# Patient Record
Sex: Female | Born: 1975
Health system: Southern US, Community
[De-identification: ages and names within clinical notes are randomized; demographics above are authoritative.]

## PROBLEM LIST (undated history)

## (undated) DIAGNOSIS — F329 Major depressive disorder, single episode, unspecified: Secondary | ICD-10-CM

## (undated) DIAGNOSIS — J45909 Unspecified asthma, uncomplicated: Secondary | ICD-10-CM

## (undated) DIAGNOSIS — T4145XA Adverse effect of unspecified anesthetic, initial encounter: Secondary | ICD-10-CM

## (undated) DIAGNOSIS — D219 Benign neoplasm of connective and other soft tissue, unspecified: Secondary | ICD-10-CM

## (undated) DIAGNOSIS — G43909 Migraine, unspecified, not intractable, without status migrainosus: Secondary | ICD-10-CM

## (undated) DIAGNOSIS — F32A Depression, unspecified: Secondary | ICD-10-CM

## (undated) DIAGNOSIS — M797 Fibromyalgia: Secondary | ICD-10-CM

## (undated) DIAGNOSIS — T8859XA Other complications of anesthesia, initial encounter: Secondary | ICD-10-CM

## (undated) DIAGNOSIS — K219 Gastro-esophageal reflux disease without esophagitis: Secondary | ICD-10-CM

## (undated) DIAGNOSIS — Z8781 Personal history of (healed) traumatic fracture: Secondary | ICD-10-CM

## (undated) DIAGNOSIS — Z803 Family history of malignant neoplasm of breast: Secondary | ICD-10-CM

## (undated) DIAGNOSIS — Z9109 Other allergy status, other than to drugs and biological substances: Secondary | ICD-10-CM

## (undated) DIAGNOSIS — Z8489 Family history of other specified conditions: Secondary | ICD-10-CM

## (undated) DIAGNOSIS — K819 Cholecystitis, unspecified: Secondary | ICD-10-CM

## (undated) DIAGNOSIS — S42009A Fracture of unspecified part of unspecified clavicle, initial encounter for closed fracture: Secondary | ICD-10-CM

## (undated) DIAGNOSIS — Z800 Family history of malignant neoplasm of digestive organs: Secondary | ICD-10-CM

## (undated) HISTORY — DX: Benign neoplasm of connective and other soft tissue, unspecified: D21.9

## (undated) HISTORY — DX: Family history of malignant neoplasm of digestive organs: Z80.0

## (undated) HISTORY — DX: Depression, unspecified: F32.A

## (undated) HISTORY — DX: Family history of malignant neoplasm of breast: Z80.3

## (undated) HISTORY — DX: Gastro-esophageal reflux disease without esophagitis: K21.9

## (undated) HISTORY — DX: Major depressive disorder, single episode, unspecified: F32.9

---
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
## 1998-09-06 HISTORY — PX: ORIF CLAVICLE FRACTURE: SUR924

## 2000-09-15 ENCOUNTER — Emergency Department (HOSPITAL_COMMUNITY): Admission: EM | Admit: 2000-09-15 | Discharge: 2000-09-15 | Payer: Self-pay | Admitting: Emergency Medicine

## 2000-09-15 ENCOUNTER — Encounter: Payer: Self-pay | Admitting: Emergency Medicine

## 2000-09-16 ENCOUNTER — Emergency Department (HOSPITAL_COMMUNITY): Admission: EM | Admit: 2000-09-16 | Discharge: 2000-09-16 | Payer: Self-pay

## 2000-09-17 ENCOUNTER — Emergency Department (HOSPITAL_COMMUNITY): Admission: EM | Admit: 2000-09-17 | Discharge: 2000-09-17 | Payer: Self-pay | Admitting: Emergency Medicine

## 2001-01-23 ENCOUNTER — Other Ambulatory Visit: Admission: RE | Admit: 2001-01-23 | Discharge: 2001-01-23 | Payer: Self-pay | Admitting: Obstetrics and Gynecology

## 2002-02-09 ENCOUNTER — Other Ambulatory Visit: Admission: RE | Admit: 2002-02-09 | Discharge: 2002-02-09 | Payer: Self-pay | Admitting: Obstetrics and Gynecology

## 2003-03-12 ENCOUNTER — Other Ambulatory Visit: Admission: RE | Admit: 2003-03-12 | Discharge: 2003-03-12 | Payer: Self-pay | Admitting: Obstetrics and Gynecology

## 2004-04-22 ENCOUNTER — Other Ambulatory Visit: Admission: RE | Admit: 2004-04-22 | Discharge: 2004-04-22 | Payer: Self-pay | Admitting: Obstetrics and Gynecology

## 2004-09-30 ENCOUNTER — Ambulatory Visit: Payer: Self-pay | Admitting: Internal Medicine

## 2004-10-19 ENCOUNTER — Ambulatory Visit: Payer: Self-pay | Admitting: Internal Medicine

## 2004-10-26 ENCOUNTER — Ambulatory Visit: Payer: Self-pay | Admitting: Internal Medicine

## 2004-11-04 ENCOUNTER — Ambulatory Visit: Payer: Self-pay | Admitting: Internal Medicine

## 2004-11-16 ENCOUNTER — Ambulatory Visit: Payer: Self-pay | Admitting: Internal Medicine

## 2004-11-20 ENCOUNTER — Ambulatory Visit: Payer: Self-pay | Admitting: Internal Medicine

## 2004-11-25 ENCOUNTER — Ambulatory Visit: Payer: Self-pay | Admitting: Internal Medicine

## 2004-12-11 ENCOUNTER — Ambulatory Visit: Payer: Self-pay | Admitting: Internal Medicine

## 2004-12-16 ENCOUNTER — Ambulatory Visit: Payer: Self-pay | Admitting: Internal Medicine

## 2004-12-25 ENCOUNTER — Ambulatory Visit: Payer: Self-pay | Admitting: Internal Medicine

## 2005-01-11 ENCOUNTER — Ambulatory Visit: Payer: Self-pay | Admitting: Internal Medicine

## 2005-01-21 ENCOUNTER — Ambulatory Visit: Payer: Self-pay | Admitting: Internal Medicine

## 2005-01-26 ENCOUNTER — Ambulatory Visit: Payer: Self-pay | Admitting: Internal Medicine

## 2005-02-09 ENCOUNTER — Ambulatory Visit: Payer: Self-pay | Admitting: Internal Medicine

## 2005-02-24 ENCOUNTER — Ambulatory Visit: Payer: Self-pay | Admitting: Internal Medicine

## 2005-03-02 ENCOUNTER — Ambulatory Visit: Payer: Self-pay | Admitting: Internal Medicine

## 2005-03-16 ENCOUNTER — Ambulatory Visit: Payer: Self-pay | Admitting: Internal Medicine

## 2005-03-23 ENCOUNTER — Ambulatory Visit: Payer: Self-pay | Admitting: Internal Medicine

## 2005-03-30 ENCOUNTER — Ambulatory Visit: Payer: Self-pay | Admitting: Internal Medicine

## 2005-04-05 ENCOUNTER — Ambulatory Visit: Payer: Self-pay | Admitting: Internal Medicine

## 2005-04-21 ENCOUNTER — Ambulatory Visit: Payer: Self-pay | Admitting: Internal Medicine

## 2005-05-07 ENCOUNTER — Ambulatory Visit: Payer: Self-pay | Admitting: Internal Medicine

## 2005-05-14 ENCOUNTER — Other Ambulatory Visit: Admission: RE | Admit: 2005-05-14 | Discharge: 2005-05-14 | Payer: Self-pay | Admitting: Obstetrics and Gynecology

## 2005-05-19 ENCOUNTER — Ambulatory Visit: Payer: Self-pay | Admitting: Internal Medicine

## 2005-05-31 ENCOUNTER — Ambulatory Visit: Payer: Self-pay | Admitting: Internal Medicine

## 2005-06-21 ENCOUNTER — Ambulatory Visit: Payer: Self-pay | Admitting: Internal Medicine

## 2005-06-29 ENCOUNTER — Ambulatory Visit: Payer: Self-pay | Admitting: Internal Medicine

## 2005-07-02 ENCOUNTER — Ambulatory Visit: Payer: Self-pay | Admitting: Internal Medicine

## 2005-11-12 ENCOUNTER — Ambulatory Visit: Payer: Self-pay | Admitting: Internal Medicine

## 2006-01-27 ENCOUNTER — Encounter: Admission: RE | Admit: 2006-01-27 | Discharge: 2006-01-27 | Payer: Self-pay | Admitting: Gastroenterology

## 2006-02-03 ENCOUNTER — Ambulatory Visit (HOSPITAL_COMMUNITY): Admission: RE | Admit: 2006-02-03 | Discharge: 2006-02-03 | Payer: Self-pay | Admitting: Internal Medicine

## 2006-06-10 ENCOUNTER — Ambulatory Visit: Payer: Self-pay | Admitting: Internal Medicine

## 2006-06-15 ENCOUNTER — Ambulatory Visit: Payer: Self-pay | Admitting: Internal Medicine

## 2006-06-17 ENCOUNTER — Other Ambulatory Visit: Admission: RE | Admit: 2006-06-17 | Discharge: 2006-06-17 | Payer: Self-pay | Admitting: Obstetrics and Gynecology

## 2006-12-08 ENCOUNTER — Ambulatory Visit: Payer: Self-pay | Admitting: Internal Medicine

## 2006-12-09 ENCOUNTER — Ambulatory Visit: Payer: Self-pay | Admitting: Internal Medicine

## 2007-03-27 ENCOUNTER — Ambulatory Visit: Payer: Self-pay | Admitting: Internal Medicine

## 2007-04-03 ENCOUNTER — Ambulatory Visit: Payer: Self-pay | Admitting: Internal Medicine

## 2007-04-05 ENCOUNTER — Ambulatory Visit: Payer: Self-pay | Admitting: Internal Medicine

## 2007-08-17 ENCOUNTER — Ambulatory Visit: Payer: Self-pay | Admitting: Internal Medicine

## 2007-09-24 ENCOUNTER — Ambulatory Visit: Admission: RE | Admit: 2007-09-24 | Discharge: 2007-09-24 | Payer: Self-pay | Admitting: Chiropractic Medicine

## 2007-10-04 ENCOUNTER — Other Ambulatory Visit: Admission: RE | Admit: 2007-10-04 | Discharge: 2007-10-04 | Payer: Self-pay | Admitting: Obstetrics and Gynecology

## 2007-12-22 ENCOUNTER — Ambulatory Visit: Payer: Self-pay | Admitting: Internal Medicine

## 2007-12-22 DIAGNOSIS — J45998 Other asthma: Secondary | ICD-10-CM | POA: Insufficient documentation

## 2007-12-22 DIAGNOSIS — F329 Major depressive disorder, single episode, unspecified: Secondary | ICD-10-CM | POA: Insufficient documentation

## 2008-03-06 ENCOUNTER — Ambulatory Visit: Payer: Self-pay | Admitting: Internal Medicine

## 2008-03-07 ENCOUNTER — Ambulatory Visit: Payer: Self-pay | Admitting: Internal Medicine

## 2008-06-21 ENCOUNTER — Ambulatory Visit: Payer: Self-pay | Admitting: Internal Medicine

## 2008-06-30 DIAGNOSIS — J3089 Other allergic rhinitis: Secondary | ICD-10-CM

## 2008-06-30 DIAGNOSIS — J302 Other seasonal allergic rhinitis: Secondary | ICD-10-CM | POA: Insufficient documentation

## 2008-08-15 ENCOUNTER — Ambulatory Visit: Payer: Self-pay | Admitting: Internal Medicine

## 2008-11-08 ENCOUNTER — Other Ambulatory Visit: Admission: RE | Admit: 2008-11-08 | Discharge: 2008-11-08 | Payer: Self-pay | Admitting: Obstetrics and Gynecology

## 2009-01-01 ENCOUNTER — Ambulatory Visit: Payer: Self-pay | Admitting: Internal Medicine

## 2009-03-18 ENCOUNTER — Emergency Department (HOSPITAL_COMMUNITY): Admission: EM | Admit: 2009-03-18 | Discharge: 2009-03-18 | Payer: Self-pay | Admitting: Emergency Medicine

## 2009-05-06 ENCOUNTER — Telehealth (INDEPENDENT_AMBULATORY_CARE_PROVIDER_SITE_OTHER): Payer: Self-pay | Admitting: *Deleted

## 2009-05-06 ENCOUNTER — Encounter: Payer: Self-pay | Admitting: Internal Medicine

## 2009-09-02 ENCOUNTER — Ambulatory Visit: Payer: Self-pay | Admitting: Internal Medicine

## 2009-11-07 ENCOUNTER — Ambulatory Visit: Payer: Self-pay | Admitting: Internal Medicine

## 2010-01-20 LAB — HM PAP SMEAR

## 2010-03-03 ENCOUNTER — Telehealth: Payer: Self-pay | Admitting: Internal Medicine

## 2010-05-26 ENCOUNTER — Ambulatory Visit: Payer: Self-pay | Admitting: Internal Medicine

## 2010-10-06 NOTE — Miscellaneous (Signed)
Summary: Injection Orders / [facility]    Injection Orders / [facility]    Imported By: Lennie Odor 02/03/2010 15:23:27  _____________________________________________________________________  External Attachment:    Type:   Image     Comment:   External Document

## 2010-10-06 NOTE — Assessment & Plan Note (Signed)
Summary: follow up/klw   Primary Provider/Referring Provider:  Trula Slade  CC:  follow up visit.  History of Present Illness: Current Problems:  DEPRESSION (ICD-311) ASTHMA (ICD-493.90) ALLERGIC RHINITIS (ICD-477.9)   12/22/07- She recently lost her job and is going back to school.  She refers to this is a high stress time.  She began wheezing two weeks ago, and her primary medical office gave Advairr 100/50 and a prednisone taper, which as been completed.  She is only used the Advair sample without prescription.  A similar episode in February of last year turned into pneumonia.  She continues allergy vaccine without problems.  There has been some postnasal drip.  They have an industrial strength home air cleaner system her husband installed.  She says it iincinerates the dust.  We discussed the possibility that it could be an ozone generating system.  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 Weather change makes eyes itch, contacts. Rare need Maxair rescue inhaler. Dropped off Advair for summer, will restart as Late Fall and Winter return. Denies fever, purulent, wheeze, cough, chest pain or palpitation. No probs w/ allergy vaccine.  September 02, 2009  allergic rhinitis asthma Two episodes at work at [employer]- diffuse red flush, sore throat. Cleared spontaneuously after claritin, going to ER. Happened both times while typing. Felt onsetr as hard pain right chest, then heart punding, then diaphoresis. Both times happened with Claritin already in her system that morning. Happened at least once the day after an allergy shot. First time there was abdominal cramping,  nausea and she wondered about tuna fish allergy. Denies herbals or supplements. last episode was in July. Both times were immediately premenstrual. "Looked like huge hive". She went to her primary office and they got 24hr urine ? for 5HIAA/ Serotonin w/u.  Takes creon for nonspecific digestive  disorder.     Current Medications (verified): 1)  Wellbutrin Xl 300 Mg  Tb24 (Bupropion Hcl) .... Take 1 By Mouth Once Daily 2)  Nuvaring 0.12-0.015 Mg/24hr  Ring (Etonogestrel-Ethinyl Estradiol) 3)  Creon 5 16.02-08-17.75 Mu  Cpep (Amylase-Lipase-Protease) .... With Meals 4)  Claritin 10 Mg  Tabs (Loratadine) .... Take 1 By Mouth Once Daily 5)  Maxair Autohaler 200 Mcg/inh  Aerb (Pirbuterol Acetate) .... As Needed 6)  Allergy Vaccine 1:10 G.o. (W-E) 7)  Epipen 0.3 Mg/0.82ml (1:1000)  Devi (Epinephrine Hcl (Anaphylaxis)) .... Use As Directed As Needed 8)  Advair Diskus 100-50 Mcg/dose  Misc (Fluticasone-Salmeterol) .Marland Kitchen.. 1 Puff Two Times A Day 9)  Ambien 5 Mg Tabs (Zolpidem Tartrate) .... Take 1 By Mouth At Bedtime As Needed 10)  Imitrex 50 Mg Tabs (Sumatriptan Succinate) .... Take As Needed Headaches As Directed  Allergies (verified): 1)  ! Sulfa 2)  ! Erythromycin 3)  ! Iodine 4)  ! * Latex  Past History:  Past Medical History: Last updated: 12/22/2007 Allergic Rhinitis Asthma Depression  Social History: Last updated: 09/02/2009 Patient never smoked.  Married Raises horses  Risk Factors: Smoking Status: never (12/22/2007)  Social History: Patient never smoked.  Married Raises horses  Review of Systems      See HPI  The patient denies anorexia, fever, weight loss, weight gain, vision loss, decreased hearing, hoarseness, chest pain, syncope, dyspnea on exertion, peripheral edema, prolonged cough, headaches, hemoptysis, abdominal pain, and severe indigestion/heartburn.    Vital Signs:  Patient profile:   35 year old female Weight:      160.50 pounds O2 Sat:      99 % on Room  air Pulse rate:   102 / minute BP sitting:   118 / 80  (left arm) Cuff size:   regular  Vitals Entered By: Reynaldo Minium CMA (September 02, 2009 4:09 PM)  O2 Flow:  Room air  Physical Exam  Additional Exam:  General: A/Ox3; pleasant and cooperative, NAD, wdwn SKIN: no rash,  lesions NODES: no lymphadenopathy HEENT: [place]/AT, EOM- WNL, Conjuctivae- clear, PERRLA, TM-WNL, Nose- clear, Throat- clear and wnl NECK: Supple w/ fair ROM, JVD- none, normal carotid impulses w/o bruits Thyroid-  CHEST: Clear to P&A HEART: RRR, no m/g/r heard ABDOMEN: Soft and nl; DGL:OVFI, nl pulses, no edema  NEURO: Grossly intact to observation      Impression & Recommendations:  Problem # 1:  RHINOCONJUNCTIVITIS, ALLERGIC (ICD-477.9)  Contolled. The two episodes of flushing are isolated and dificult to characterize. I can't tell if she is describing a flushing syndrome, or an angioedema/ urticaria. She did itch. It happened despite having claritin on board. I will change her maintenance antihistamine to more potent fexofenadine. This might be carcinoid. Relation to menses??Doubt endometriosis. This does not seem to be related to allergy vaccineor a specific food, exposure or med. Her updated medication list for this problem includ    Claritin 10 Mg Tabs (Loratadine) .Marland Kitchen... Take 1 by mouth once daily  Problem # 2:  ? of ANGIONEUROTIC EDEMA (ICD-995.1) As above.  Medications Added to Medication List This Visit: 1)  Ambien 5 Mg Tabs (Zolpidem tartrate) .... Take 1 by mouth at bedtime as needed 2)  Imitrex 50 Mg Tabs (Sumatriptan succinate) .... Take as needed headaches as directed 3)  Fexofenadine Hcl 180 Mg Tabs (Fexofenadine hcl) .Marland Kitchen.. 1 daily. may take 1 extra  Other Orders: Est. Patient Level III (43329)  Patient Instructions: 1)  Please schedule a follow-up appointment in 6 months. 2)  Try maintenance antihistamine for now using generic Allegra/ fexofenadine instead of Claritin/ lotatadine. 3)  continue allergy shots Prescriptions: FEXOFENADINE HCL 180 MG TABS (FEXOFENADINE HCL) 1 daily. may take 1 extra  #30 x prn   Entered and Authorized by:   Waymon Budge MD   Signed by:   Waymon Budge MD on 09/02/2009   Method used:   Print then Give to Patient   RxID:    [id number]

## 2010-10-06 NOTE — Progress Notes (Signed)
Summary: Advance to 1:10 vaccine  Phone Note Outgoing Call   Call placed by: Lynnae Sandhoff Call placed to: Patient Details for Reason: 1:10? Summary of Call: Pt. has been on 1:50 for sometime; her vac. isn't helping as much as it once was. Pt. would like to try going to 1:10. Please let us know when  you get a chance. Initial call taken by: Dimas Millin,  May 06, 2009 3:54 PM  Follow-up for Phone Call        Advance to 1:10 Follow-up by: Waymon Budge MD,  May 06, 2009 8:10 PM

## 2010-10-06 NOTE — Progress Notes (Signed)
Summary: nos appt  Phone Note Call from Patient   Caller: [email or web address]  Call For: Monica Hansen Summary of Call: LMTCB x2 to rsc nos from 6/27. Initial call taken by: Monica Hansen,  March 03, 2010 4:27 PM

## 2010-10-06 NOTE — Assessment & Plan Note (Signed)
Summary: fu////kp   Visit Type:  Follow-up PCP:  Trula Slade  Chief Complaint:  follow up-asthma flare up(finished Pred 20mg  x 1week).  History of Present Illness: Current Problems:  DEPRESSION (ICD-311) ASTHMA (ICD-493.90) ALLERGIC RHINITIS (ICD-477.9)  She recently lost her job and is going back to school.  She refers to this is a high stress time.  She began wheezing two weeks ago, and her primary medical office gave Advairr 100/50 and a prednisone taper, which as been completed.  She is only used the Advair sample without prescription.  A similar episode in February of last year turned into pneumonia.  She continues allergy vaccine without problems.  There has been some postnasal drip.  They have an industrial strength home air cleaner system her husband installed.  She says it iincinerates the dust.  We discussed the possibility that it could be an ozone generating system.       Current Allergies (reviewed today): ! SULFA ! ERYTHROMYCIN ! IODINE ! * LATEX  Past Medical History:    Reviewed history from 12/21/2007 and no changes required:       Allergic Rhinitis       Asthma       Depression          Social History:    Reviewed history and no changes required:       Patient never smoked.    Risk Factors:  Tobacco use:  never   Review of Systems      See HPI   Vital Signs:  Patient Profile:   35 Years Old Female Weight:      157.13 pounds O2 Sat:      94 % O2 treatment:    Room Air Pulse rate:   88 / minute BP sitting:   110 / 62  (right arm) Cuff size:   regular  Vitals Entered By: Reynaldo Minium CMA (December 22, 2007 3:36 PM)             Comments Medications reviewed with patient  ..................................................................Marland KitchenReynaldo Minium CMA  December 22, 2007 3:36 PM      Physical Exam  General:     normal appearance and healthy appearing.   Eyes:     PERRLA/EOM intact; conjunctiva and sclera clear Nose:     clear nasal  discharge.   Mouth:     no deformity or lesions Neck:     no JVD.   Lungs:     clear bilaterally to auscultation and percussion Heart:     regular rate and rhythm, S1, S2 without murmurs, rubs, gallops, or clicks Cervical Nodes:     no significant adenopathy Axillary Nodes:     no significant adenopathy     Problem # 1:  ASTHMA (ICD-493.90) Recent exacerbation, just finishing a course of Avelox and prednisone. We discussed Advair. we discussed use of a peak flow meter.. Her updated medication list for this problem includes:    Maxair Autohaler 200 Mcg/inh Aerb (Pirbuterol acetate) .Marland Kitchen... As needed    Advair Diskus 100-50 Mcg/dose Misc (Fluticasone-salmeterol) .Marland Kitchen... 1 puff two times a day   Problem # 2:  ALLERGIC RHINITIS (ICD-477.9) Residual rhinitis, probably allergic. Her updated medication list for this problem includes:    Claritin 10 Mg Tabs (Loratadine) .Marland Kitchen... Take 1 by mouth once daily   Medications Added to Medication List This Visit: 1)  Claritin 10 Mg Tabs (Loratadine) .... Take 1 by mouth once daily 2)  Advair Diskus 100-50 Mcg/dose Misc (Fluticasone-salmeterol) .Marland KitchenMarland KitchenMarland Kitchen  1 puff two times a day   Patient Instructions: 1)  Please schedule a follow-up appointment in 6 months. 2)  Advair refilled- if stable you can try using it just once a day 3)  Peak Flow meter    Prescriptions: ADVAIR DISKUS 100-50 MCG/DOSE  MISC (FLUTICASONE-SALMETEROL) 1 puff two times a day  #1 x prn   Entered and Authorized by:   Waymon Budge MD   Signed by:   Waymon Budge MD on 12/22/2007   Method used:   Electronically sent to ...       [id number]       [address]       [place], [place]  [address]       Ph: [phone] or [phone]       Fax: [phone]   RxID:   [id number]  ]

## 2010-10-06 NOTE — Assessment & Plan Note (Signed)
Summary: 6 months/apc   PCP:  Trula Slade  Chief Complaint:  6 month followup.  Breathing well and no complaints..  History of Present Illness: Current Problems:  DEPRESSION (ICD-311) ASTHMA (ICD-493.90) ALLERGIC RHINITIS (ICD-477.9)   12/22/07- She recently lost her job and is going back to school.  She refers to this is a high stress time.  She began wheezing two weeks ago, and her primary medical office gave Advairr 100/50 and a prednisone taper, which as been completed.  She is only used the Advair sample without prescription.  A similar episode in February of last year turned into pneumonia.  She continues allergy vaccine without problems.  There has been some postnasal drip.  They have an industrial strength home air cleaner system her husband installed.  She says it iincinerates the dust.  We discussed the possibility that it could be an ozone generating system.  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 Weather change makes eyes itch, contacts. Rare need Maxair rescue inhaler. Dropped off Advair for summer, will restart as Late Fall and Winter return. Denies fever, purulent, wheeze, cough, chest pain or palpitation. No probs w/ allergy vaccine.        Current Allergies: ! SULFA ! ERYTHROMYCIN ! IODINE ! * LATEX  Past Medical History:    Reviewed history from 12/22/2007 and no changes required:       Allergic Rhinitis       Asthma       Depression          Social History:    Reviewed history from 12/22/2007 and no changes required:       Patient never smoked.     Review of Systems      See HPI   Vital Signs:  Patient Profile:   35 Years Old Female Weight:      159 pounds O2 Sat:      99 % O2 treatment:    Room Air Pulse rate:   84 / minute BP sitting:   122 / 70  (left arm)  Vitals Entered By: Vernie Murders (June 21, 2008 2:07 PM)                 Physical Exam  General: A/Ox3; pleasant and cooperative, NAD, SKIN: no rash,  lesions NODES: no lymphadenopathy HEENT: [place]/AT, EOM- WNL, Conjuctivae- clear, PERRLA, TM-WNL, Nose- clear, Throat- clear and wnl NECK: Supple w/ fair ROM, JVD- none, normal carotid impulses w/o bruits Thyroid- normal to palpation CHEST: Clear to P&A HEART: RRR, no m/g/r heard ABDOMEN: Soft and nl; nml bowel sounds; no organomegaly or masses noted YNW:GNFA, nl pulses, no edema  NEURO: Grossly intact to observation         Problem # 1:  RHINOCONJUNCTIVITIS, ALLERGIC (ICD-477.9) Tolerating exposures well. Discussed eye meds, contact lenses, allergy vaccine risk and benefit. Pataday. Her updated medication list for this problem includes:    Claritin 10 Mg Tabs (Loratadine) .Marland Kitchen... Take 1 by mouth once daily   Problem # 2:  ASTHMA (ICD-493.90) Resuming maintenance controller for the season. Med talk. Her updated medication list for this problem includes:    Maxair Autohaler 200 Mcg/inh Aerb (Pirbuterol acetate) .Marland Kitchen... As needed    Advair Diskus 100-50 Mcg/dose Misc (Fluticasone-salmeterol) .Marland Kitchen... 1 puff two times a day    Patient Instructions: 1)  Please schedule a follow-up appointment in 1 year. 2)  Continue allergy vaccine- script for syringes 3)  Try sample Pataday eye drop at bedtime- after contacts out, one drop  in each eye.   ]

## 2010-12-24 ENCOUNTER — Ambulatory Visit (INDEPENDENT_AMBULATORY_CARE_PROVIDER_SITE_OTHER): Payer: Self-pay

## 2010-12-24 DIAGNOSIS — J309 Allergic rhinitis, unspecified: Secondary | ICD-10-CM

## 2011-01-22 NOTE — Assessment & Plan Note (Signed)
[facility]                               PULMONARY OFFICE NOTE   NAME:Marland, KALEYA DOUSE                     MRN:          [id number]  DATE:06/15/2006                            DOB:          1975/12/29    PULMONARY FOLLOWUP:   PROBLEM LIST:  1. Allergic asthma.  2. Allergic rhinitis.   HISTORY:  A one year followup.  She has had a flu shot.  Her husband is  giving her allergy shots.  We took time today again to review risk, benefit,  and goals of allergy vaccine therapy.  We discussed issues of administration  outside of a medical office, anaphylaxis and epinephrine.  We refilled her  EpiPen.  She describes some sinus pressure this fall without much discharge  and no wheezing.  She says her husband occasionally wakes her to tell her  she needs to take a deep breath while sleeping, but she does not snore, has  a headache described as tension in her neck.  Using saline nasal spray.  Rare wheeze.  Exposure to dust and mold may cause hives.  Overall, she has  done fairly well.   MEDICATIONS:  1. Allergy vaccine.  2. Protonix.  3. Wellbutrin XL 300 mg.  4. Nuva ring.  5. Creon.  6. Allegra b.i.d. p.r.n.  7. Maxair or albuterol p.r.n.  8. EpiPen.   DRUG INTOLERANCE:  1. SULFA.  2. ERYTHROMYCIN.  3. QUESTIONABLY TO IODINE.  4. LATEX.   OBJECTIVE:  VITAL SIGNS:  Weight 149 pounds, BP 110/70, pulse regular 78,  room air saturation 98%.  GENERAL:  She looks quite comfortable.  EYES/NOSE/EARS/THROAT:  All clear today.  CHEST:  Clear to P&A.  HEART:  Sounds are regular without murmur.   IMPRESSION:  Mild nasal congestion this fall suggesting some seasonal  rhinitis but otherwise stable.   PLAN:  1. We discussed treatment options and environmental precautions.  2. Refill albuterol, nasal fluticasone spray, and her EpiPen.  3. Schedule return one year, earlier p.r.n.       Clinton D. Maple Hudson, MD, FCCP, FACP      CDY/MedQ  DD:   06/16/2006  DT:  06/18/2006  Job #:  [id number]

## 2011-06-01 ENCOUNTER — Telehealth: Payer: Self-pay | Admitting: Internal Medicine

## 2011-06-01 MED ORDER — ALBUTEROL SULFATE HFA 108 (90 BASE) MCG/ACT IN AERS: 2.0000 | INHALATION_SPRAY | Freq: Four times a day (QID) | RESPIRATORY_TRACT | Status: DC | PRN

## 2011-06-01 NOTE — Telephone Encounter (Signed)
Pt was last seen 08/2009 by CDY.  She has scheduled an OV with him for 06/21/2011.  I do not see proair on pt's med list but there is maxair prn.  I called and spoke with pt.  She is requesting a proair inhaler bc the one she has is old.  States she is having " a little bit of a flair up but it's not bad."  States she is having a hard time getting deep breath, occas, slight chest tightness, and slight nonprod cough x 3 days.  She has been using the proair inhaler with relief.  Dr. Maple Hudson, pls advise if ok to call in a proair inhaler for pt but she will need to keep OV.  Thanks!  [facility]

## 2011-06-01 NOTE — Telephone Encounter (Signed)
Per CY-take Maxair off list to avoid duplication and add Proair #1 2 puffs qid prn with prn refills; keep appt with CY.

## 2011-06-01 NOTE — Telephone Encounter (Signed)
Called pt's home and work numbers - [email or web address] to inform pt proair rx sent to pharm but she will need to keep appt with CDY for additional refills.

## 2011-06-01 NOTE — Telephone Encounter (Signed)
Spoke with pt and notified that rx has been sent and should keep her ov with CDY for additional refills. Pt verbalized understanding and states will keep the appt.

## 2011-06-21 ENCOUNTER — Encounter: Payer: Self-pay | Admitting: Internal Medicine

## 2011-06-21 ENCOUNTER — Ambulatory Visit (INDEPENDENT_AMBULATORY_CARE_PROVIDER_SITE_OTHER): Payer: BC Managed Care – PPO | Admitting: Internal Medicine

## 2011-06-21 VITALS — BP 124/82 | HR 99 | Ht 64.0 in | Wt 160.0 lb

## 2011-06-21 DIAGNOSIS — J45909 Unspecified asthma, uncomplicated: Secondary | ICD-10-CM

## 2011-06-21 DIAGNOSIS — Z23 Encounter for immunization: Secondary | ICD-10-CM

## 2011-06-21 DIAGNOSIS — J309 Allergic rhinitis, unspecified: Secondary | ICD-10-CM

## 2011-06-21 MED ORDER — FLUTICASONE PROPIONATE 50 MCG/ACT NA SUSP: NASAL | Status: DC

## 2011-06-21 MED ORDER — FLUTICASONE-SALMETEROL 100-50 MCG/DOSE IN AEPB: 1.0000 | INHALATION_SPRAY | Freq: Two times a day (BID) | RESPIRATORY_TRACT | Status: DC

## 2011-06-21 MED ORDER — ALBUTEROL SULFATE HFA 108 (90 BASE) MCG/ACT IN AERS: 2.0000 | INHALATION_SPRAY | RESPIRATORY_TRACT | Status: DC | PRN

## 2011-06-21 NOTE — Progress Notes (Signed)
06/21/11- 35 year old female never smoker followed for asthma, allergic rhinitis, urticaria Last here-09/02/2009. Has not had recent hives. She continues taking Creon for a nonspecific GI, not CF. She says she had an episode of malaise which finally turned out to be associated with use of a weed killing spray outside of her window at work. She is pleased with allergy vaccine and convinced that helps. 1:50 is sufficient. She does take a daily antihistamine mainly out of habit and we discussed this. Always some fullness behind the eyes and the left medial eyebrow. Using an occasional Benadryl or Sudafed. She remembers using a nasal steroid spray over a year ago and concluded it was little benefit. Notes a little wheezing only with colds.   ROS See HPI Constitutional:   No-   weight loss, night sweats, fevers, chills, fatigue, lassitude. HEENT:   No-  headaches, difficulty swallowing, tooth/dental problems, sore throat,       + sneezing, itching, ear ache, nasal congestion, post nasal drip,  CV:  No-   chest pain, orthopnea, PND, swelling in lower extremities, anasarca, dizziness, palpitations Resp: No-   shortness of breath with exertion or at rest.              No-   productive cough,  No non-productive cough,  No- coughing up of blood.              No-   change in color of mucus.  No- wheezing.   Skin: No-   rash or lesions. GI:  No-   heartburn, indigestion, abdominal pain, nausea, vomiting, diarrhea,                 change in bowel habits, loss of appetite GU: No-   dysuria, change in color of urine, no urgency or frequency.  No- flank pain. MS:  No-   joint pain or swelling.  No- decreased range of motion.  No- back pain. Neuro-     nothing unusual Psych:  No- change in mood or affect. No depression or anxiety.  No memory loss.  OBJ General- Alert, Oriented, Affect-appropriate, Distress- none acute; overweight Skin- rash-none, lesions- none, excoriation- none Lymphadenopathy-  none Head- atraumatic            Eyes- Gross vision intact, PERRLA, conjunctivae clear secretions. Periorbital edema            Ears- Hearing, canals-normal            Nose- Clear, no-Septal dev, mucus, polyps, erosion, perforation             Throat- Mallampati II , mucosa clear , drainage- none, tonsils- atrophic Neck- flexible , trachea midline, no stridor , thyroid nl, carotid no bruit Chest - symmetrical excursion , unlabored           Heart/CV- RRR , no murmur , no gallop  , no rub, nl s1 s2                           - JVD- none , edema- none, stasis changes- none, varices- none           Lung- clear to P&A, wheeze- none, cough- none , dullness-none, rub- none           Chest wall-  Abd- tender-no, distended-no, bowel sounds-present, HSM- no Br/ Gen/ Rectal- Not done, not indicated Extrem- cyanosis- none, clubbing, none, atrophy- none, strength- nl Neuro- grossly intact to observation

## 2011-06-21 NOTE — Patient Instructions (Signed)
Script sent for [employer] rescue inhaler- you can ask them to put it on hold  Script to try Flonase/ fluticasone nasal spray- maintenance  Refilled Advair  Flu vax today  We can increase the strength of your allergy shots if this isn't sufficient

## 2011-06-24 NOTE — Assessment & Plan Note (Signed)
She volunteers allergy vaccine is helpful. Question now if she has a low-grade persistent sinusitis. We are refilling and restarting Flonase to be used on a continuous maintenance basis. Decongestants discussed. Saline lavage discussed.

## 2011-06-24 NOTE — Assessment & Plan Note (Signed)
3 mild intermittent asthma mainly related to viral infections now.  Plan flu vaccine

## 2011-07-05 ENCOUNTER — Telehealth: Payer: Self-pay | Admitting: *Deleted

## 2011-07-05 NOTE — Telephone Encounter (Signed)
Pt.called to order vac.said you were going to increase the strength. In your notes you said we could increase her vac.. I haven't heard anything from you so I wanted to check with you first.

## 2011-07-07 ENCOUNTER — Ambulatory Visit (INDEPENDENT_AMBULATORY_CARE_PROVIDER_SITE_OTHER): Payer: BC Managed Care – PPO

## 2011-07-07 DIAGNOSIS — J309 Allergic rhinitis, unspecified: Secondary | ICD-10-CM

## 2011-07-07 NOTE — Telephone Encounter (Signed)
Increasing vaccine to 1:10.

## 2011-09-02 ENCOUNTER — Ambulatory Visit (INDEPENDENT_AMBULATORY_CARE_PROVIDER_SITE_OTHER): Payer: BC Managed Care – PPO

## 2011-09-02 DIAGNOSIS — J309 Allergic rhinitis, unspecified: Secondary | ICD-10-CM

## 2011-12-21 ENCOUNTER — Ambulatory Visit (INDEPENDENT_AMBULATORY_CARE_PROVIDER_SITE_OTHER): Payer: BC Managed Care – PPO | Admitting: Internal Medicine

## 2011-12-21 ENCOUNTER — Encounter: Payer: Self-pay | Admitting: Internal Medicine

## 2011-12-21 VITALS — BP 118/72 | HR 86 | Ht 64.0 in | Wt 146.2 lb

## 2011-12-21 DIAGNOSIS — J45909 Unspecified asthma, uncomplicated: Secondary | ICD-10-CM

## 2011-12-21 DIAGNOSIS — J309 Allergic rhinitis, unspecified: Secondary | ICD-10-CM

## 2011-12-21 MED ORDER — FLUTICASONE PROPIONATE 50 MCG/ACT NA SUSP: NASAL | Status: DC

## 2011-12-21 MED ORDER — EPINEPHRINE 0.3 MG/0.3ML IJ DEVI: 0.3000 mg | Freq: Once | INTRAMUSCULAR | Status: DC

## 2011-12-21 NOTE — Progress Notes (Signed)
06/21/11- 36 year old female never smoker followed for asthma, allergic rhinitis, urticaria Last here-09/02/2009. Has not had recent hives. She continues taking Creon for a nonspecific GI, not CF. She says she had an episode of malaise which finally turned out to be associated with use of a weed killing spray outside of her window at work. She is pleased with allergy vaccine and convinced that helps. 1:50 is sufficient. She does take a daily antihistamine mainly out of habit and we discussed this. Always some fullness behind the eyes and the left medial eyebrow. Using an occasional Benadryl or Sudafed. She remembers using a nasal steroid spray over a year ago and concluded it was little benefit. Notes a little wheezing only with colds.  12/21/11- 36 year old female never smoker followed for asthma, allergic rhinitis, urticaria Flonase helps. Occasionally needs decongestant. Has had some pressure medial aspect of left eye and nose. Continues allergy vaccine at 1:10. Using Neti pot. Has horses, donkey, llama.  ROS-See HPI Constitutional:   No-   weight loss, night sweats, fevers, chills, fatigue, lassitude. HEENT:   No-  headaches, difficulty swallowing, tooth/dental problems, sore throat,       + sneezing, itching, ear ache, nasal congestion, post nasal drip,  CV:  No-   chest pain, orthopnea, PND, swelling in lower extremities, anasarca, dizziness, palpitations Resp: No-   shortness of breath with exertion or at rest.              No-   productive cough,  No non-productive cough,  No- coughing up of blood.              No-   change in color of mucus.  No- wheezing.   Skin: No-   rash or lesions. GI:  No-   heartburn, indigestion, abdominal pain, nausea, vomiting,  GU: MS:  No-   joint pain or swelling.   Neuro-     nothing unusual Psych:  No- change in mood or affect. No depression or anxiety.  No memory loss.  OBJ General- Alert, Oriented, Affect-appropriate, Distress- none acute;  overweight Skin- rash-none, lesions- none, excoriation- none Lymphadenopathy- none Head- atraumatic            Eyes- Gross vision intact, PERRLA, conjunctivae clear secretions. Periorbital edema            Ears- Hearing, canals-normal            Nose- Clear, no-Septal dev, mucus, polyps, erosion, perforation             Throat- Mallampati II , mucosa clear , drainage- none, tonsils- atrophic Neck- flexible , trachea midline, no stridor , thyroid nl, carotid no bruit Chest - symmetrical excursion , unlabored           Heart/CV- RRR , no murmur , no gallop  , no rub, nl s1 s2                           - JVD- none , edema- none, stasis changes- none, varices- none           Lung- clear to P&A, wheeze- none, cough- none , dullness-none, rub- none           Chest wall-  Abd-  Br/ Gen/ Rectal- Not done, not indicated Extrem- cyanosis- none, clubbing, none, atrophy- none, strength- nl Neuro- grossly intact to observation

## 2011-12-21 NOTE — Patient Instructions (Signed)
Scripts sent for [employer] and Epipen, and printed for the needles.  Consider the allergen control information we discussed

## 2011-12-25 NOTE — Assessment & Plan Note (Signed)
Good control

## 2011-12-25 NOTE — Assessment & Plan Note (Signed)
Mild seasonal exacerbation of allergic rhinitis. Suggested saline gel in addition to her standard meds.

## 2012-04-26 ENCOUNTER — Telehealth: Payer: Self-pay | Admitting: Internal Medicine

## 2012-04-26 MED ORDER — FLUTICASONE-SALMETEROL 100-50 MCG/DOSE IN AEPB: 1.0000 | INHALATION_SPRAY | Freq: Two times a day (BID) | RESPIRATORY_TRACT | Status: DC

## 2012-04-26 NOTE — Telephone Encounter (Signed)
2nd Issue: Pt would like to know what to do to reorder allergy serum.  Monica Hansen

## 2012-04-26 NOTE — Telephone Encounter (Signed)
LMOM for pt to call with list of symptoms.  Refill for Advair was sent to pharmacy.  Spoke with Selena Batten in allergy lab and she will contact pt regarding ordering allergy serum.

## 2012-04-27 NOTE — Telephone Encounter (Signed)
LMTCB

## 2012-04-28 ENCOUNTER — Ambulatory Visit (INDEPENDENT_AMBULATORY_CARE_PROVIDER_SITE_OTHER): Payer: BC Managed Care – PPO

## 2012-04-28 DIAGNOSIS — J309 Allergic rhinitis, unspecified: Secondary | ICD-10-CM

## 2012-04-28 NOTE — Telephone Encounter (Signed)
[id number] on both numbers listed.Carron Curie, CMA

## 2012-05-01 NOTE — Telephone Encounter (Signed)
Per protocol will sign off on message and await call back. Jennifer Castillo, CMA  

## 2012-06-18 ENCOUNTER — Encounter (HOSPITAL_COMMUNITY): Payer: Self-pay | Admitting: Anesthesiology

## 2012-06-18 ENCOUNTER — Emergency Department (HOSPITAL_COMMUNITY): Payer: BC Managed Care – PPO | Admitting: Anesthesiology

## 2012-06-18 ENCOUNTER — Ambulatory Visit (HOSPITAL_COMMUNITY)
Admission: EM | Admit: 2012-06-18 | Discharge: 2012-06-19 | Disposition: A | Discharge status: Home / Self Care | Payer: BC Managed Care – PPO | Attending: General Surgery | Admitting: General Surgery

## 2012-06-18 ENCOUNTER — Emergency Department (HOSPITAL_COMMUNITY): Payer: BC Managed Care – PPO

## 2012-06-18 ENCOUNTER — Encounter (HOSPITAL_COMMUNITY): Payer: Self-pay | Admitting: Emergency Medicine

## 2012-06-18 ENCOUNTER — Encounter (HOSPITAL_COMMUNITY)
Admission: EM | Disposition: A | Discharge status: Home / Self Care | Payer: Self-pay | Source: Home / Self Care | Attending: Emergency Medicine

## 2012-06-18 DIAGNOSIS — F329 Major depressive disorder, single episode, unspecified: Secondary | ICD-10-CM | POA: Insufficient documentation

## 2012-06-18 DIAGNOSIS — J45909 Unspecified asthma, uncomplicated: Secondary | ICD-10-CM | POA: Insufficient documentation

## 2012-06-18 DIAGNOSIS — K3580 Unspecified acute appendicitis: Secondary | ICD-10-CM | POA: Insufficient documentation

## 2012-06-18 DIAGNOSIS — K37 Unspecified appendicitis: Secondary | ICD-10-CM

## 2012-06-18 DIAGNOSIS — Z79899 Other long term (current) drug therapy: Secondary | ICD-10-CM | POA: Insufficient documentation

## 2012-06-18 DIAGNOSIS — F3289 Other specified depressive episodes: Secondary | ICD-10-CM | POA: Insufficient documentation

## 2012-06-18 HISTORY — PX: LAPAROSCOPIC APPENDECTOMY: SHX408

## 2012-06-18 LAB — URINALYSIS, ROUTINE W REFLEX MICROSCOPIC
Bilirubin Urine: NEGATIVE
Glucose, UA: NEGATIVE mg/dL
Hgb urine dipstick: NEGATIVE
Nitrite: NEGATIVE
Protein, ur: NEGATIVE mg/dL
Specific Gravity, Urine: 1.021 (ref 1.005–1.030)
Urobilinogen, UA: 0.2 mg/dL (ref 0.0–1.0)
pH: 6 (ref 5.0–8.0)

## 2012-06-18 LAB — COMPREHENSIVE METABOLIC PANEL
ALT: 16 U/L (ref 0–35)
AST: 20 U/L (ref 0–37)
Albumin: 3.7 g/dL (ref 3.5–5.2)
Alkaline Phosphatase: 116 U/L (ref 39–117)
BUN: 9 mg/dL (ref 6–23)
CO2: 25 mEq/L (ref 19–32)
Calcium: 9.5 mg/dL (ref 8.4–10.5)
Chloride: 99 mEq/L (ref 96–112)
Creatinine, Ser: 0.63 mg/dL (ref 0.50–1.10)
GFR calc Af Amer: >90 mL/min (ref >90)
GFR calc non Af Amer: >90 mL/min (ref >90)
Glucose, Bld: 93 mg/dL (ref 70–99)
Potassium: 3.5 mEq/L (ref 3.5–5.1)
Sodium: 134 mEq/L — ABNORMAL LOW (ref 135–145)
Total Bilirubin: 0.2 mg/dL — ABNORMAL LOW (ref 0.3–1.2)
Total Protein: 7.4 g/dL (ref 6.0–8.3)

## 2012-06-18 LAB — CBC WITH DIFFERENTIAL/PLATELET
Basophils Absolute: 0 10*3/uL (ref 0.0–0.1)
Basophils Relative: 0 % (ref 0–1)
Eosinophils Absolute: 0.2 10*3/uL (ref 0.0–0.7)
Eosinophils Relative: 2 % (ref 0–5)
HCT: 41.4 % (ref 36.0–46.0)
Hemoglobin: 14.2 g/dL (ref 12.0–15.0)
Lymphocytes Relative: 36 % (ref 12–46)
Lymphs Abs: 3.9 10*3/uL (ref 0.7–4.0)
MCH: 29.9 pg (ref 26.0–34.0)
MCHC: 34.3 g/dL (ref 30.0–36.0)
MCV: 87.2 fL (ref 78.0–100.0)
Monocytes Absolute: 0.7 10*3/uL (ref 0.1–1.0)
Monocytes Relative: 6 % (ref 3–12)
Neutro Abs: 6 10*3/uL (ref 1.7–7.7)
Neutrophils Relative %: 56 % (ref 43–77)
Platelets: 287 10*3/uL (ref 150–400)
RBC: 4.75 MIL/uL (ref 3.87–5.11)
RDW: 12.2 % (ref 11.5–15.5)
WBC: 10.8 10*3/uL — ABNORMAL HIGH (ref 4.0–10.5)

## 2012-06-18 LAB — PREGNANCY, URINE: Preg Test, Ur: NEGATIVE

## 2012-06-18 LAB — URINE MICROSCOPIC-ADD ON

## 2012-06-18 LAB — LIPASE, BLOOD: Lipase: 20 U/L (ref 11–59)

## 2012-06-18 SURGERY — APPENDECTOMY, LAPAROSCOPIC
Anesthesia: General | Site: Abdomen | Wound class: Contaminated

## 2012-06-18 MED ORDER — SODIUM CHLORIDE 0.9 % IV SOLN
INTRAVENOUS | Status: AC
  Filled 2012-06-18: qty 3

## 2012-06-18 MED ORDER — ACETAMINOPHEN 10 MG/ML IV SOLN
INTRAVENOUS | Status: AC
  Filled 2012-06-18: qty 100

## 2012-06-18 MED ORDER — SODIUM CHLORIDE 0.9 % IV SOLN
3.0000 g | INTRAVENOUS | Status: DC | PRN
  Administered 2012-06-18: 3 g via INTRAVENOUS

## 2012-06-18 MED ORDER — LIDOCAINE HCL (CARDIAC) 20 MG/ML IV SOLN
INTRAVENOUS | Status: DC | PRN
  Administered 2012-06-18: 30 mg via INTRAVENOUS

## 2012-06-18 MED ORDER — LACTATED RINGERS IV SOLN
INTRAVENOUS | Status: DC | PRN
  Administered 2012-06-18 (×3): via INTRAVENOUS

## 2012-06-18 MED ORDER — HYDROMORPHONE HCL PF 1 MG/ML IJ SOLN
INTRAMUSCULAR | Status: AC
  Filled 2012-06-18: qty 1

## 2012-06-18 MED ORDER — PROPOFOL 10 MG/ML IV EMUL
INTRAVENOUS | Status: DC | PRN
  Administered 2012-06-18: 150 mg via INTRAVENOUS

## 2012-06-18 MED ORDER — ONDANSETRON HCL 4 MG/2ML IJ SOLN
INTRAMUSCULAR | Status: DC | PRN
  Administered 2012-06-18 (×2): 2 mg via INTRAVENOUS

## 2012-06-18 MED ORDER — HYDROMORPHONE HCL PF 1 MG/ML IJ SOLN
0.2500 mg | INTRAMUSCULAR | Status: DC | PRN
  Administered 2012-06-18 – 2012-06-19 (×3): 0.5 mg via INTRAVENOUS

## 2012-06-18 MED ORDER — LACTATED RINGERS IV SOLN: INTRAVENOUS | Status: DC

## 2012-06-18 MED ORDER — LACTATED RINGERS IR SOLN
Status: DC | PRN
  Administered 2012-06-18: 1000 mL

## 2012-06-18 MED ORDER — MIDAZOLAM HCL 5 MG/5ML IJ SOLN
INTRAMUSCULAR | Status: DC | PRN
  Administered 2012-06-18: 1 mg via INTRAVENOUS

## 2012-06-18 MED ORDER — ACETAMINOPHEN 10 MG/ML IV SOLN
INTRAVENOUS | Status: DC | PRN
  Administered 2012-06-18: 1000 mg via INTRAVENOUS

## 2012-06-18 MED ORDER — SUCCINYLCHOLINE CHLORIDE 20 MG/ML IJ SOLN
INTRAMUSCULAR | Status: DC | PRN
  Administered 2012-06-18: 100 mg via INTRAVENOUS

## 2012-06-18 MED ORDER — FENTANYL CITRATE 0.05 MG/ML IJ SOLN
INTRAMUSCULAR | Status: DC | PRN
  Administered 2012-06-18: 25 ug via INTRAVENOUS
  Administered 2012-06-18: 75 ug via INTRAVENOUS

## 2012-06-18 MED ORDER — 0.9 % SODIUM CHLORIDE (POUR BTL) OPTIME
TOPICAL | Status: DC | PRN
  Administered 2012-06-18: 1000 mL

## 2012-06-18 MED ORDER — NEOSTIGMINE METHYLSULFATE 1 MG/ML IJ SOLN
INTRAMUSCULAR | Status: DC | PRN
  Administered 2012-06-18: 3 mg via INTRAVENOUS

## 2012-06-18 MED ORDER — BUPIVACAINE HCL (PF) 0.5 % IJ SOLN
INTRAMUSCULAR | Status: DC | PRN
  Administered 2012-06-18: 12 mL

## 2012-06-18 MED ORDER — CISATRACURIUM BESYLATE (PF) 10 MG/5ML IV SOLN
INTRAVENOUS | Status: DC | PRN
  Administered 2012-06-18: 7 mg via INTRAVENOUS

## 2012-06-18 MED ORDER — OXYCODONE-ACETAMINOPHEN 5-325 MG PO TABS: 1.0000 | ORAL_TABLET | ORAL | Status: DC | PRN

## 2012-06-18 MED ORDER — GLYCOPYRROLATE 0.2 MG/ML IJ SOLN
INTRAMUSCULAR | Status: DC | PRN
  Administered 2012-06-18: 0.4 mg via INTRAVENOUS

## 2012-06-18 SURGICAL SUPPLY — 46 items
APL SKNCLS STERI-STRIP NONHPOA (GAUZE/BANDAGES/DRESSINGS) ×1
APPLIER CLIP 5 13 M/L LIGAMAX5 (MISCELLANEOUS)
APPLIER CLIP ROT 10 11.4 M/L (STAPLE)
APR CLP MED LRG 11.4X10 (STAPLE)
APR CLP MED LRG 5 ANG JAW (MISCELLANEOUS)
BAG SPEC RTRVL LRG 6X4 10 (ENDOMECHANICALS) ×1
BENZOIN TINCTURE PRP APPL 2/3 (GAUZE/BANDAGES/DRESSINGS) ×2 IMPLANT
CANISTER SUCTION 2500CC (MISCELLANEOUS) ×2 IMPLANT
CATH FOLEY LATEX FREE 16FR (CATHETERS) ×1 IMPLANT
CHLORAPREP W/TINT 26ML (MISCELLANEOUS) ×2 IMPLANT
CLIP APPLIE 5 13 M/L LIGAMAX5 (MISCELLANEOUS) IMPLANT
CLIP APPLIE ROT 10 11.4 M/L (STAPLE) IMPLANT
CLOTH BEACON ORANGE TIMEOUT ST (SAFETY) ×2 IMPLANT
CUTTER FLEX LINEAR 45M (STAPLE) ×1 IMPLANT
DECANTER SPIKE VIAL GLASS SM (MISCELLANEOUS) ×2 IMPLANT
DRAPE LAPAROSCOPIC ABDOMINAL (DRAPES) ×2 IMPLANT
DRAPE UTILITY XL STRL (DRAPES) ×2 IMPLANT
DRSG TEGADERM 2-3/8X2-3/4 SM (GAUZE/BANDAGES/DRESSINGS) ×6 IMPLANT
ELECT REM PT RETURN 9FT ADLT (ELECTROSURGICAL) ×2
ELECTRODE REM PT RTRN 9FT ADLT (ELECTROSURGICAL) ×1 IMPLANT
ENDOLOOP SUT PDS II  0 18 (SUTURE)
ENDOLOOP SUT PDS II 0 18 (SUTURE) IMPLANT
GLOVE BIOGEL PI IND STRL 7.0 (GLOVE) ×1 IMPLANT
GLOVE BIOGEL PI INDICATOR 7.0 (GLOVE) ×1
GLOVE ECLIPSE 8.0 STRL XLNG CF (GLOVE) ×2 IMPLANT
GLOVE INDICATOR 8.0 STRL GRN (GLOVE) ×4 IMPLANT
GOWN STRL NON-REIN LRG LVL3 (GOWN DISPOSABLE) ×2 IMPLANT
GOWN STRL REIN XL XLG (GOWN DISPOSABLE) ×4 IMPLANT
KIT BASIN OR (CUSTOM PROCEDURE TRAY) ×2 IMPLANT
PENCIL BUTTON HOLSTER BLD 10FT (ELECTRODE) ×2 IMPLANT
POUCH SPECIMEN RETRIEVAL 10MM (ENDOMECHANICALS) ×1 IMPLANT
RELOAD 45 VASCULAR/THIN (ENDOMECHANICALS) IMPLANT
RELOAD STAPLE 45 2.5 WHT GRN (ENDOMECHANICALS) IMPLANT
RELOAD STAPLE 45 3.5 BLU ETS (ENDOMECHANICALS) IMPLANT
RELOAD STAPLE TA45 3.5 REG BLU (ENDOMECHANICALS) ×2 IMPLANT
SCALPEL HARMONIC ACE (MISCELLANEOUS) ×1 IMPLANT
SET IRRIG TUBING LAPAROSCOPIC (IRRIGATION / IRRIGATOR) ×2 IMPLANT
SOLUTION ANTI FOG 6CC (MISCELLANEOUS) ×2 IMPLANT
STRIP CLOSURE SKIN 1/2X4 (GAUZE/BANDAGES/DRESSINGS) ×2 IMPLANT
SUT MNCRL AB 4-0 PS2 18 (SUTURE) ×2 IMPLANT
TOWEL OR 17X26 10 PK STRL BLUE (TOWEL DISPOSABLE) ×2 IMPLANT
TRAY FOLEY CATH 14FRSI W/METER (CATHETERS) ×1 IMPLANT
TRAY LAP CHOLE (CUSTOM PROCEDURE TRAY) ×2 IMPLANT
TROCAR BLADELESS OPT 5 75 (ENDOMECHANICALS) ×4 IMPLANT
TROCAR XCEL BLUNT TIP 100MML (ENDOMECHANICALS) ×1 IMPLANT
TUBING INSUFFLATION 10FT LAP (TUBING) ×2 IMPLANT

## 2012-06-18 NOTE — ED Notes (Signed)
Pt presenting to ed with c/o sent from urgent care for r/o appendicitis pt states positive right side pain x 3 days. Pt denies nausea, vomiting and diarrhea at this time. Pt states she has had positive fevers. Pt states she was told she had an elevated WBC. Pt states she hasn't been able to eat.

## 2012-06-18 NOTE — Transfer of Care (Signed)
Immediate Anesthesia Transfer of Care Note  Patient: Monica Hansen  Procedure(s) Performed: Procedure(s) (LRB) with comments: APPENDECTOMY LAPAROSCOPIC (N/A)  Patient Location: PACU  Anesthesia Type: General  Level of Consciousness: awake and sedated  Airway & Oxygen Therapy: Patient Spontanous Breathing and Patient connected to face mask oxygen  Post-op Assessment: Report given to PACU RN and Post -op Vital signs reviewed and stable  Post vital signs: Reviewed and stable  Complications: No apparent anesthesia complications

## 2012-06-18 NOTE — Anesthesia Preprocedure Evaluation (Addendum)
Anesthesia Evaluation  Patient identified by MRN, date of birth, ID band Patient awake    Reviewed: Allergy & Precautions, H&P , NPO status , Patient's Chart, lab work & pertinent test results  Airway Mallampati: II TM Distance: >3 FB Neck ROM: full    Dental No notable dental hx. (+) Teeth Intact and Dental Advisory Given   Pulmonary asthma ,  Mod. To severe asthma breath sounds clear to auscultation  Pulmonary exam normal       Cardiovascular Exercise Tolerance: Good negative cardio ROS  Rhythm:regular Rate:Normal     Neuro/Psych Depression negative neurological ROS  negative psych ROS   GI/Hepatic negative GI ROS, Neg liver ROS,   Endo/Other  negative endocrine ROS  Renal/GU negative Renal ROS  negative genitourinary   Musculoskeletal   Abdominal   Peds  Hematology negative hematology ROS (+)   Anesthesia Other Findings   Reproductive/Obstetrics negative OB ROS                          Anesthesia Physical Anesthesia Plan  ASA: II  Anesthesia Plan: General   Post-op Pain Management:    Induction: Intravenous, Rapid sequence and Cricoid pressure planned  Airway Management Planned: Oral ETT  Additional Equipment:   Intra-op Plan:   Post-operative Plan: Extubation in OR  Informed Consent: I have reviewed the patients History and Physical, chart, labs and discussed the procedure including the risks, benefits and alternatives for the proposed anesthesia with the patient or authorized representative who has indicated his/her understanding and acceptance.   Dental Advisory Given  Plan Discussed with: CRNA and Surgeon  Anesthesia Plan Comments:         Anesthesia Quick Evaluation

## 2012-06-18 NOTE — H&P (Signed)
Monica Hansen is an 36 y.o. female.   Chief Complaint: Right lower quadrant abdominal pain HPI: She developed periumbilical pain about 3 days ago. The pain radiated down to the right lower quadrant with severe last night. She has had subjective fever and chills with some nausea. She also reports some dysuria. She presented to the emergency department for evaluation. She was noted to have a mild leukocytosis. A CT scan of the abdomen and pelvis was performed. This demonstrated findings consistent with early acute appendicitis. She had a previous CT scan in the past and her appendix was 1.5 times larger than previously. I was asked to see her because of this.  Past Medical History  Diagnosis Date  . Allergic rhinitis   . Asthma   . Depression      PSH:  ORIF clavicle fracture  No family history on file. Social History:  reports that she has never smoked. She does not have any smokeless tobacco history on file. She reports that she does not use illicit drugs. Her alcohol history not on file.  Prior to Admission medications   Medication Sig Start Date End Date Taking? Authorizing Provider  acetaminophen (TYLENOL) 325 MG tablet Take 650 mg by mouth every 6 (six) hours as needed. Pain   Yes Historical Provider, MD  albuterol (PROVENTIL HFA;VENTOLIN HFA) 108 (90 BASE) MCG/ACT inhaler Inhale 2 puffs into the lungs every 4 (four) hours as needed. Asthma 06/21/11 06/20/12 Yes Waymon Budge, MD  buPROPion (WELLBUTRIN XL) 300 MG 24 hr tablet Take 300 mg by mouth daily.     Yes Historical Provider, MD  EPINEPHrine (EPI-PEN) 0.3 mg/0.3 mL DEVI Inject 0.3 mg into the muscle once. 12/21/11 12/20/12 Yes Waymon Budge, MD  etonogestrel-ethinyl estradiol (NUVARING) 0.12-0.015 MG/24HR vaginal ring Place 1 each vaginally every 28 (twenty-eight) days. Insert vaginally and leave in place for 3 consecutive weeks, then remove for 1 week.    Yes Historical Provider, MD  fluticasone (FLONASE) 50 MCG/ACT nasal  spray Place 1 spray into the nose daily. 1-2 sprays each nostril once daily at bedtime 12/21/11 12/20/12 Yes Clinton D Young, MD  Fluticasone-Salmeterol (ADVAIR) 100-50 MCG/DOSE AEPB Inhale 1 puff into the lungs every 12 (twelve) hours. Rinse mouth 04/26/12 04/26/13 Yes Clinton D Young, MD  loratadine (CLARITIN) 10 MG tablet Take 10 mg by mouth daily.     Yes Historical Provider, MD  zolpidem (AMBIEN) 5 MG tablet Take 5 mg by mouth at bedtime as needed. Insomnia   Yes Historical Provider, MD    Allergies:  Allergies  Allergen Reactions  . Erythromycin Nausea And Vomiting  . Iodine     REACTION: questionable  . Latex     REACTION: questionable  . Sulfonamide Derivatives Nausea And Vomiting  . Tetracyclines & Related Swelling     (Not in a hospital admission)  Results for orders placed during the hospital encounter of 06/18/12 (from the past 48 hour(s))  CBC WITH DIFFERENTIAL     Status: Abnormal   Collection Time   06/18/12  5:09 PM      Component Value Range Comment   WBC 10.8 (*) 4.0 - 10.5 K/uL    RBC 4.75  3.87 - 5.11 MIL/uL    Hemoglobin 14.2  12.0 - 15.0 g/dL    HCT 45.4  09.8 - 11.9 %    MCV 87.2  78.0 - 100.0 fL    MCH 29.9  26.0 - 34.0 pg    MCHC 34.3  30.0 -  36.0 g/dL    RDW 40.9  81.1 - 91.4 %    Platelets 287  150 - 400 K/uL    Neutrophils Relative 56  43 - 77 %    Neutro Abs 6.0  1.7 - 7.7 K/uL    Lymphocytes Relative 36  12 - 46 %    Lymphs Abs 3.9  0.7 - 4.0 K/uL    Monocytes Relative 6  3 - 12 %    Monocytes Absolute 0.7  0.1 - 1.0 K/uL    Eosinophils Relative 2  0 - 5 %    Eosinophils Absolute 0.2  0.0 - 0.7 K/uL    Basophils Relative 0  0 - 1 %    Basophils Absolute 0.0  0.0 - 0.1 K/uL   COMPREHENSIVE METABOLIC PANEL     Status: Abnormal   Collection Time   06/18/12  5:09 PM      Component Value Range Comment   Sodium 134 (*) 135 - 145 mEq/L    Potassium 3.5  3.5 - 5.1 mEq/L    Chloride 99  96 - 112 mEq/L    CO2 25  19 - 32 mEq/L    Glucose, Bld 93   70 - 99 mg/dL    BUN 9  6 - 23 mg/dL    Creatinine, Ser 7.82  0.50 - 1.10 mg/dL    Calcium 9.5  8.4 - 95.6 mg/dL    Total Protein 7.4  6.0 - 8.3 g/dL    Albumin 3.7  3.5 - 5.2 g/dL    AST 20  0 - 37 U/L    ALT 16  0 - 35 U/L    Alkaline Phosphatase 116  39 - 117 U/L    Total Bilirubin 0.2 (*) 0.3 - 1.2 mg/dL    GFR calc non Af Amer >90  >90 mL/min    GFR calc Af Amer >90  >90 mL/min   LIPASE, BLOOD     Status: Normal   Collection Time   06/18/12  5:09 PM      Component Value Range Comment   Lipase 20  11 - 59 U/L    Ct Abdomen Pelvis Wo Contrast  06/18/2012  *RADIOLOGY REPORT*  Clinical Data: Right lower quadrant abdominal pain.  Appendicitis. Contrast allergy.  CT ABDOMEN AND PELVIS WITHOUT CONTRAST  Technique:  Multidetector CT imaging of the abdomen and pelvis was performed following the standard protocol without intravenous contrast.  Comparison: 02/03/2006.  Findings: Lung Bases: Clear.  Liver:  Unenhanced CT was performed per clinician order.  Lack of IV contrast limits sensitivity and specificity, especially for evaluation of abdominal/pelvic solid viscera.  Grossly normal.  Spleen:  Normal.  Gallbladder:  Normal.  Common bile duct:  Normal.  Pancreas:  Normal.  Adrenal glands:  Normal.  Kidneys:  Normal.  Stomach:  Normal.  Small bowel:  Normal.  Colon:   The appendix is mildly enlarged, measuring 9 mm.  No periappendiceal inflammatory changes are identified.  There appears to be thickening of the base of the appendix near the origin on the cecum.  There may be some adjacent cecal wall thickening.  No perforation is identified.  Colon demonstrates a large stool burden.  Pelvic Genitourinary:  Urinary bladder normal.  Low density ring is present within the vagina, measuring 5.5 cm which may represent diaphragm or pessary.  Bones:  No aggressive osseous lesions.  Vasculature: Grossly normal.  IMPRESSION: Mild enlargement the appendix measuring 9 mm, with indistinct margins.  Due to the  change from the prior exam, the findings are suspicious for early acute appendicitis.  Previously the appendix was air-filled and measured under 6 mm.   Original Report Authenticated By: Andreas Newport, M.D.     Review of Systems  Constitutional: Positive for fever, chills and weight loss (intentional).  Respiratory: Negative.   Cardiovascular: Negative.   Gastrointestinal: Positive for nausea and abdominal pain.  Genitourinary: Positive for dysuria. Negative for hematuria.  Musculoskeletal: Positive for back pain.  Endo/Heme/Allergies:       No bleeding disorders or blood clots.    Blood pressure 139/97, pulse 79, temperature 97.8 F (36.6 C), temperature source Oral, last menstrual period 06/11/2012, SpO2 100.00%. Physical Exam  Constitutional: She appears well-developed and well-nourished. No distress.  HENT:  Head: Normocephalic and atraumatic.  Eyes: No scleral icterus.  Neck: Neck supple.  Cardiovascular: Normal rate and regular rhythm.   Respiratory: Effort normal and breath sounds normal.  GI: Soft. She exhibits no distension and no mass. There is tenderness (RLQ). There is guarding (RLQ).  Musculoskeletal: She exhibits no edema.  Lymphadenopathy:    She has no cervical adenopathy.  Neurological: She is alert.  Skin: Skin is warm and dry.  Psychiatric: She has a normal mood and affect. Her behavior is normal.     Assessment/Plan Acute appendicitis.  Plan: Laparoscopic possible open appendectomy.  I have discussed the procedure and risks of appendectomy. The risks include but are not limited to bleeding, infection, wound problems, anesthesia, injury to intra-abdominal organs. She seems to understand and agrees with the plan.  She reports that she had some problems with wheezing and asthma after her previous general anesthetic. I told her I would relate this to the anesthesia staff.  Bevan Disney J 06/18/2012, 9:10 PM

## 2012-06-18 NOTE — Op Note (Signed)
Appendectomy, Lap, Procedure Note  Pre-operative Diagnosis: Acute appendicitis without mention of peritonitis  Post-operative Diagnosis: Same  Surgeon:  Avel Peace, M.D.  Anesthesia:  General   Indications:  This is a 36 year old female who presented to the emergency department with right lower quadrant pain as well as subjective fever and chills. She had a leukocytosis and right lower quadrant tenderness. CT scan suggested acute appendicitis and she is brought to the operating room for the above procedure.    Surgeon: Adolph Pollack   Assistants: none  Anesthesia: General endotracheal anesthesia  ASA Class: 2  Procedure Details  She was taken to Operating Room, identified as Nigel Mormon and the procedure verified as Appendectomy. A Time Out was held and the above information confirmed.  The patient was placed in the supine position and general anesthesia was induced, along with placement of orogastric tube, SCDs, and a Foley catheter. The abdomen was prepped and draped in a sterile fashion. A small infraumbilical incision was made through the skin, subcutaneous tissue, fascia, and peritoneum entering the peritoneal cavity under direct vision.  A pursestring suture was passed around the fascia with a 0 Vicryl.  The Hasson was introduced into the peritoneal cavity and the tails of the suture were used to hold the Hasson in place.   The pneumoperitoneum was then established to steady pressure of 15 mmHg.  Additional 5 mm cannulas were then placed in the left lower quadrant of the abdomen and the right upper quadrant region under direct visualization. A careful evaluation of the entire abdomen was carried out. The patient was placed in Trendelenburg and left lateral decubitus position. The small intestines were retracted in the cephalad and left lateral direction away from the pelvis and right lower quadrant. The patient was found to have an enlarged and inflamed appendix that  was extending into the pelvis. There was no evidence of perforation.  The appendix was carefully mobilized. The mesoappendix was was divided with the harmonic scalpel.   The appendix was amputated off the cecum, with a small cuff of cecum, using an endo-GIA stapler.  It was placed in a retrieval bag and removed through the subumbilical port incision.  There was bleeding from the staple line that was controlled with hemoclips.  There was no leakage from the staple line.  Copious irrigation was  performed and irrigant fluid suctioned from the abdomen as much as possible.  The umbilical trocar was removed and the  port site fascia was closed via the purse string suture under laparoscopic vision. There was no residual palpable fascial defect.  The remaining trocars were removed and all  trocar site skin wounds were closed with 4-0 Monocryl.  Instrument, sponge, and needle counts were correct at the conclusion of the case.   Findings: The appendix was found to be inflamed. There were not signs of necrosis.  There was not perforation. There was not abscess formation.  Estimated Blood Loss:  less than 100 mL         Drains: none         Specimens: appendix         Complications:  None; patient tolerated the procedure well.         Disposition: PACU - hemodynamically stable.         Condition: stable

## 2012-06-18 NOTE — ED Provider Notes (Signed)
History  This chart was scribed for non-physician practitioner working with Doug Sou, MD by Bennett Scrape. This patient was seen in room WA08/WA08 and the patient's care was started at 4:34PM.  CSN: [id number]  Arrival date & time 06/18/12  1623   First MD Initiated Contact with Patient 06/05/12 4:34PM    Chief Complaint  Patient presents with  . r/o appendicitis    The history is provided by the patient. No language interpreter was used.    Monica Hansen is a 36 y.o. female who presents to the Emergency Department complaining of 3 days of constant RLQ abdominal pain that started in the RUQ described as cramping with intermittent stabbing, sharp pangs with associated dysuria at the end of the stream, decreased appetite and intermittent fever. She states that she was seen at [facility] walk-in clinic today and was sent here for a possible appendicitis. She reports that movement makes the pain worse and she rates her pain a 7 or 8 out of 10 at its worst and a 5 out of 10 currently. She reports that she took tylenol and gas relievers with no improvement in her symptoms. She confirms prior episodes of similar symptoms attributed to a kidney infection; as well as, prior episodes of similar symptoms attributed to her h/o IBS. She denies vaginal bleeding or discharge, nausea, emesis and diarrhea as associated symptoms. She also has a h/o asthma and depression and denies smoking.   Past Medical History  Diagnosis Date  . Allergic rhinitis   . Asthma   . Depression     History reviewed. No pertinent past surgical history.  No family history on file.  History  Substance Use Topics  . Smoking status: Never Smoker   . Smokeless tobacco: Not on file  . Alcohol Use: Not on file    No OB history provided.  Review of Systems  Constitutional: Positive for fever (intermittent) and appetite change (decreased). Negative for chills.  Gastrointestinal: Positive for abdominal  pain. Negative for nausea, vomiting and diarrhea.  Genitourinary: Positive for dysuria. Negative for frequency, vaginal bleeding and vaginal discharge.  All other systems reviewed and are negative.    Allergies  Erythromycin; Iodine; Latex; Sulfonamide derivatives; and Tetracyclines & related  Home Medications   Current Outpatient Rx  Name Route Sig Dispense Refill  . ACETAMINOPHEN 325 MG PO TABS Oral Take 650 mg by mouth every 6 (six) hours as needed. Pain    . ALBUTEROL SULFATE HFA 108 (90 BASE) MCG/ACT IN AERS Inhalation Inhale 2 puffs into the lungs every 4 (four) hours as needed. Asthma    . BUPROPION HCL ER (XL) 300 MG PO TB24 Oral Take 300 mg by mouth daily.      Marland Kitchen EPINEPHRINE 0.3 MG/0.3ML IJ DEVI Intramuscular Inject 0.3 mg into the muscle once.    . ETONOGESTREL-ETHINYL ESTRADIOL 0.12-0.015 MG/24HR VA RING Vaginal Place 1 each vaginally every 28 (twenty-eight) days. Insert vaginally and leave in place for 3 consecutive weeks, then remove for 1 week.     Marland Kitchen FLUTICASONE PROPIONATE 50 MCG/ACT NA SUSP Nasal Place 1 spray into the nose daily. 1-2 sprays each nostril once daily at bedtime    . FLUTICASONE-SALMETEROL 100-50 MCG/DOSE IN AEPB Inhalation Inhale 1 puff into the lungs every 12 (twelve) hours. Rinse mouth    . LORATADINE 10 MG PO TABS Oral Take 10 mg by mouth daily.      Marland Kitchen ZOLPIDEM TARTRATE 5 MG PO TABS Oral Take 5 mg by mouth  at bedtime as needed. Insomnia      Triage Vitals: BP 139/97  Pulse 79  Temp 97.8 F (36.6 C) (Oral)  SpO2 100%  Physical Exam  Nursing note and vitals reviewed. Constitutional: She is oriented to person, place, and time. She appears well-developed and well-nourished. No distress.  HENT:  Head: Normocephalic and atraumatic.  Eyes: Conjunctivae normal and EOM are normal.  Neck: Neck supple. No tracheal deviation present.  Cardiovascular: Normal rate, regular rhythm and intact distal pulses.        Heart sounds are normal to ausculation     Pulmonary/Chest: Effort normal and breath sounds normal. No respiratory distress.  Abdominal: Soft. Bowel sounds are normal. There is no rebound.       Bowel sounds are present and normal, abdomen soft, no peritoneal signs, + RLQ pain.  Musculoskeletal: Normal range of motion.  Neurological: She is alert and oriented to person, place, and time.  Skin: Skin is warm and dry.  Psychiatric: She has a normal mood and affect. Her behavior is normal.    ED Course  Procedures (including critical care time)  DIAGNOSTIC STUDIES: Oxygen Saturation is 100% on room air, normal by my interpretation.    COORDINATION OF CARE: 4:42PM-Pt turned down medications. Discussed treatment plan which includes blood work and a CT scan of the abdomen with pt at bedside and pt agreed to plan.   Labs Reviewed  CBC WITH DIFFERENTIAL - Abnormal; Notable for the following:    WBC 10.8 (*)     All other components within normal limits  COMPREHENSIVE METABOLIC PANEL - Abnormal; Notable for the following:    Sodium 134 (*)     Total Bilirubin 0.2 (*)     All other components within normal limits  LIPASE, BLOOD  URINALYSIS, ROUTINE W REFLEX MICROSCOPIC  PREGNANCY, URINE   Ct Abdomen Pelvis Wo Contrast  06/18/2012  *RADIOLOGY REPORT*  Clinical Data: Right lower quadrant abdominal pain.  Appendicitis. Contrast allergy.  CT ABDOMEN AND PELVIS WITHOUT CONTRAST  Technique:  Multidetector CT imaging of the abdomen and pelvis was performed following the standard protocol without intravenous contrast.  Comparison: 02/03/2006.  Findings: Lung Bases: Clear.  Liver:  Unenhanced CT was performed per clinician order.  Lack of IV contrast limits sensitivity and specificity, especially for evaluation of abdominal/pelvic solid viscera.  Grossly normal.  Spleen:  Normal.  Gallbladder:  Normal.  Common bile duct:  Normal.  Pancreas:  Normal.  Adrenal glands:  Normal.  Kidneys:  Normal.  Stomach:  Normal.  Small bowel:  Normal.  Colon:    The appendix is mildly enlarged, measuring 9 mm.  No periappendiceal inflammatory changes are identified.  There appears to be thickening of the base of the appendix near the origin on the cecum.  There may be some adjacent cecal wall thickening.  No perforation is identified.  Colon demonstrates a large stool burden.  Pelvic Genitourinary:  Urinary bladder normal.  Low density ring is present within the vagina, measuring 5.5 cm which may represent diaphragm or pessary.  Bones:  No aggressive osseous lesions.  Vasculature: Grossly normal.  IMPRESSION: Mild enlargement the appendix measuring 9 mm, with indistinct margins.  Due to the change from the prior exam, the findings are suspicious for early acute appendicitis.  Previously the appendix was air-filled and measured under 6 mm.   Original Report Authenticated By: Andreas Newport, M.D.      No diagnosis found.    MDM  Mild acute appy  36 year old female presenting to ER complaining of right lower quadrant abdominal pain gradually worsening over the last 3 days.  Labs and imaging reviewed showing leukocytosis and enlarged appendix suspicious for acute early appendicitis. General surgery has been consulted and patient is to be admitted.  Vital signs normal throughout hospital stay. The patient appears reasonably stabilized for admission considering the current resources, flow, and capabilities available in the ED at this time, and I doubt any other [facility] requiring further screening and/or treatment in the ED prior to admission. BP 139/97  Pulse 79  Temp 97.8 F (36.6 C) (Oral)  SpO2 100%  LMP 06/11/2012    I personally performed the services described in this documentation, which was scribed in my presence. The recorded information has been reviewed and considered.    Jaci Carrel, [place] 06/18/12 [id number]

## 2012-06-19 ENCOUNTER — Other Ambulatory Visit (INDEPENDENT_AMBULATORY_CARE_PROVIDER_SITE_OTHER): Payer: Self-pay | Admitting: General Surgery

## 2012-06-19 ENCOUNTER — Encounter (HOSPITAL_COMMUNITY): Payer: Self-pay | Admitting: *Deleted

## 2012-06-19 ENCOUNTER — Telehealth (INDEPENDENT_AMBULATORY_CARE_PROVIDER_SITE_OTHER): Payer: Self-pay | Admitting: General Surgery

## 2012-06-19 DIAGNOSIS — K37 Unspecified appendicitis: Secondary | ICD-10-CM

## 2012-06-19 MED ORDER — OXYCODONE-ACETAMINOPHEN 5-325 MG PO TABS
1.0000 | ORAL_TABLET | ORAL | Status: DC | PRN
  Administered 2012-06-19: 2 via ORAL
  Filled 2012-06-19: qty 2

## 2012-06-19 MED ORDER — LORATADINE 10 MG PO TABS
10.0000 mg | ORAL_TABLET | Freq: Every day | ORAL | Status: DC
Start: 2012-06-19 — End: 2012-06-19
  Administered 2012-06-19: 10 mg via ORAL
  Filled 2012-06-19: qty 1

## 2012-06-19 MED ORDER — HYDROMORPHONE HCL PF 1 MG/ML IJ SOLN
INTRAMUSCULAR | Status: AC
  Filled 2012-06-19: qty 1

## 2012-06-19 MED ORDER — FLUTICASONE-SALMETEROL 100-50 MCG/DOSE IN AEPB: 1.0000 | INHALATION_SPRAY | Freq: Two times a day (BID) | RESPIRATORY_TRACT | Status: DC

## 2012-06-19 MED ORDER — ONDANSETRON HCL 4 MG PO TABS: 4.0000 mg | ORAL_TABLET | Freq: Four times a day (QID) | ORAL | Status: DC | PRN

## 2012-06-19 MED ORDER — PROMETHAZINE HCL 25 MG PO TABS: 25.0000 mg | ORAL_TABLET | Freq: Four times a day (QID) | ORAL | Status: DC | PRN

## 2012-06-19 MED ORDER — MORPHINE SULFATE 2 MG/ML IJ SOLN
2.0000 mg | INTRAMUSCULAR | Status: DC | PRN
  Administered 2012-06-19: 2 mg via INTRAVENOUS
  Filled 2012-06-19 (×2): qty 1

## 2012-06-19 MED ORDER — FLUTICASONE-SALMETEROL 100-50 MCG/DOSE IN AEPB
1.0000 | INHALATION_SPRAY | Freq: Two times a day (BID) | RESPIRATORY_TRACT | Status: DC
  Administered 2012-06-19: 1 via RESPIRATORY_TRACT
  Filled 2012-06-19: qty 14

## 2012-06-19 MED ORDER — KCL IN DEXTROSE-NACL 20-5-0.9 MEQ/L-%-% IV SOLN
INTRAVENOUS | Status: DC
  Administered 2012-06-19: 02:00:00 via INTRAVENOUS
  Filled 2012-06-19 (×3): qty 1000

## 2012-06-19 MED ORDER — BUPROPION HCL ER (XL) 300 MG PO TB24
300.0000 mg | ORAL_TABLET | Freq: Every day | ORAL | Status: DC
  Administered 2012-06-19: 300 mg via ORAL
  Filled 2012-06-19: qty 1

## 2012-06-19 MED ORDER — ONDANSETRON HCL 4 MG/2ML IJ SOLN
4.0000 mg | Freq: Four times a day (QID) | INTRAMUSCULAR | Status: DC | PRN
  Administered 2012-06-19: 4 mg via INTRAVENOUS
  Filled 2012-06-19: qty 2

## 2012-06-19 MED ORDER — OXYCODONE-ACETAMINOPHEN 5-325 MG PO TABS: 1.0000 | ORAL_TABLET | ORAL | Status: DC | PRN

## 2012-06-19 MED ORDER — FLUTICASONE PROPIONATE 50 MCG/ACT NA SUSP
1.0000 | Freq: Every day | NASAL | Status: DC
  Administered 2012-06-19: 1 via NASAL
  Filled 2012-06-19: qty 16

## 2012-06-19 MED ORDER — ALBUTEROL SULFATE HFA 108 (90 BASE) MCG/ACT IN AERS
2.0000 | INHALATION_SPRAY | RESPIRATORY_TRACT | Status: DC | PRN
  Filled 2012-06-19: qty 6.7

## 2012-06-19 NOTE — ED Provider Notes (Signed)
Medical screening examination/treatment/procedure(s) were performed by non-physician practitioner and as supervising physician I was immediately available for consultation/collaboration.  Malania Gawthrop, MD 06/19/12 0135 

## 2012-06-19 NOTE — Progress Notes (Signed)
Pt d/c instructions reviewed with teach back method, husband at the bedside as well. All questions and concerns addressed, VSS, pain controlled, no s/s of distress or discomfort. D/C'ed home with husband.

## 2012-06-19 NOTE — Discharge Summary (Signed)
  Patient ID: Monica Hansen MRN: [id number] DOB/AGE: 1976/01/03 36 y.o.  Admit date: 06/18/2012 Discharge date: 06/19/2012  Procedures: laparoscopic appendectomy  Consults: None  Reason for Admission: The patient was admitted to the hospital secondary to RLQ pain with findings c/w acute appendicitis.  Admission Diagnoses:  1. Acute appendicitis 2. Depression  Hospital Course: The patient was admitted and had a lap appy.  She tolerated the procedure well.  Her pain was well controlled and she was tolerating a diet.  She was stable for dc home.  Discharge Diagnoses:  1. Acute appendicitis, s/p lap appy  Discharge Medications:   Medication List     As of 06/19/2012  8:43 AM    STOP taking these medications         acetaminophen 325 MG tablet   Commonly known as: TYLENOL      TAKE these medications         albuterol 108 (90 BASE) MCG/ACT inhaler   Commonly known as: PROVENTIL HFA;VENTOLIN HFA   Inhale 2 puffs into the lungs every 4 (four) hours as needed. Asthma      buPROPion 300 MG 24 hr tablet   Commonly known as: WELLBUTRIN XL   Take 300 mg by mouth daily.      EPINEPHrine 0.3 mg/0.3 mL Devi   Commonly known as: EPI-PEN   Inject 0.3 mg into the muscle once.      etonogestrel-ethinyl estradiol 0.12-0.015 MG/24HR vaginal ring   Commonly known as: NUVARING   Place 1 each vaginally every 28 (twenty-eight) days. Insert vaginally and leave in place for 3 consecutive weeks, then remove for 1 week.      fluticasone 50 MCG/ACT nasal spray   Commonly known as: FLONASE   Place 1 spray into the nose daily. 1-2 sprays each nostril once daily at bedtime      Fluticasone-Salmeterol 100-50 MCG/DOSE Aepb   Commonly known as: ADVAIR   Inhale 1 puff into the lungs every 12 (twelve) hours. Rinse mouth      loratadine 10 MG tablet   Commonly known as: CLARITIN   Take 10 mg by mouth daily.      oxyCODONE-acetaminophen 5-325 MG per tablet   Commonly known as:  PERCOCET/ROXICET   Take 1-2 tablets by mouth every 4 (four) hours as needed for pain.      oxyCODONE-acetaminophen 5-325 MG per tablet   Commonly known as: PERCOCET/ROXICET   Take 1-2 tablets by mouth every 4 (four) hours as needed.      zolpidem 5 MG tablet   Commonly known as: AMBIEN   Take 5 mg by mouth at bedtime as needed. Insomnia        Discharge Instructions:     Follow-up Information    Follow up with [facility]. On 07/04/2012. (3:00pm, arrive at 2:30pm)    Contact information:   [address]   [place] [address] [phone]          Signed: Letha Cape 06/19/2012, 8:43 AM

## 2012-06-19 NOTE — Telephone Encounter (Signed)
Let pt know that I e-scribed phenergan 25 mg #10 1 tablet Q6H prn

## 2012-06-19 NOTE — Anesthesia Postprocedure Evaluation (Signed)
  Anesthesia Post-op Note  Patient: Monica Hansen  Procedure(s) Performed: Procedure(s) (LRB): APPENDECTOMY LAPAROSCOPIC (N/A)  Patient Location: PACU  Anesthesia Type: General  Level of Consciousness: awake and alert   Airway and Oxygen Therapy: Patient Spontanous Breathing  Post-op Pain: mild  Post-op Assessment: Post-op Vital signs reviewed, Patient's Cardiovascular Status Stable, Respiratory Function Stable, Patent Airway and No signs of Nausea or vomiting  Post-op Vital Signs: stable  Complications: No apparent anesthesia complications

## 2012-06-22 ENCOUNTER — Ambulatory Visit: Payer: BC Managed Care – PPO | Admitting: Internal Medicine

## 2012-06-26 ENCOUNTER — Telehealth (INDEPENDENT_AMBULATORY_CARE_PROVIDER_SITE_OTHER): Payer: Self-pay

## 2012-06-26 NOTE — Telephone Encounter (Signed)
The patient called regarding when she can go back to work.  She had lap appy on 10/13 and is scheduled to come back 10/29 for a postop.  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  She does no lifting.  She is still very tired.  She is eating ok but has a little nausea after she eats.  She has no fever and only minimal pain.  She was asking about light duty desk work.  Let her know if she needs to be seen 1st

## 2012-06-27 ENCOUNTER — Encounter (INDEPENDENT_AMBULATORY_CARE_PROVIDER_SITE_OTHER): Payer: Self-pay

## 2012-07-04 ENCOUNTER — Ambulatory Visit (INDEPENDENT_AMBULATORY_CARE_PROVIDER_SITE_OTHER): Payer: BC Managed Care – PPO | Admitting: Internal Medicine

## 2012-07-04 ENCOUNTER — Other Ambulatory Visit (INDEPENDENT_AMBULATORY_CARE_PROVIDER_SITE_OTHER): Payer: Self-pay | Admitting: Internal Medicine

## 2012-07-04 ENCOUNTER — Encounter (INDEPENDENT_AMBULATORY_CARE_PROVIDER_SITE_OTHER): Payer: Self-pay

## 2012-07-04 VITALS — BP 124/84 | HR 91 | Temp 98.1°F | Ht 64.0 in | Wt 149.0 lb

## 2012-07-04 DIAGNOSIS — Z9049 Acquired absence of other specified parts of digestive tract: Secondary | ICD-10-CM

## 2012-07-04 DIAGNOSIS — R1031 Right lower quadrant pain: Secondary | ICD-10-CM

## 2012-07-04 DIAGNOSIS — K3580 Unspecified acute appendicitis: Secondary | ICD-10-CM

## 2012-07-04 MED ORDER — ONDANSETRON HCL 4 MG PO TABS: 4.0000 mg | ORAL_TABLET | Freq: Four times a day (QID) | ORAL | Status: DC | PRN

## 2012-07-04 NOTE — Progress Notes (Signed)
  Subjective: Pt returns to the clinic today after undergoing laparoscopic appendectomy on 10/13.  The patient is having increasing abdominal pain over the last 2-3 days with food intolerance, nausea but no vomiting.  She has subjective fevers.  She reports the pain is like when she was first diagnosed with appendicitis.  She is also having reflux symptoms which are long standing.  Objective: Vital signs in last 24 hours: Reviewed  PE: Heart: RRR Lungs: CTA bilateral Abd: soft, tender in the RLQ, +bs, incisions well healed  Lab Results:  No results found for this basename: WBC:2,HGB:2,HCT:2,PLT:2 in the last 72 hours BMET No results found for this basename: NA:2,K:2,CL:2,CO2:2,GLUCOSE:2,BUN:2,CREATININE:2,CALCIUM:2 in the last 72 hours PT/INR No results found for this basename: LABPROT:2,INR:2 in the last 72 hours CMP     Component Value Date/Time   NA 134* 06/18/2012 1709   K 3.5 06/18/2012 1709   CL 99 06/18/2012 1709   CO2 25 06/18/2012 1709   GLUCOSE 93 06/18/2012 1709   BUN 9 06/18/2012 1709   CREATININE 0.63 06/18/2012 1709   CALCIUM 9.5 06/18/2012 1709   PROT 7.4 06/18/2012 1709   ALBUMIN 3.7 06/18/2012 1709   AST 20 06/18/2012 1709   ALT 16 06/18/2012 1709   ALKPHOS 116 06/18/2012 1709   BILITOT 0.2* 06/18/2012 1709   GFRNONAA >90 06/18/2012 1709   GFRAA >90 06/18/2012 1709   Lipase     Component Value Date/Time   LIPASE 20 06/18/2012 1709       Studies/Results: No results found.  Anti-infectives: Anti-infectives    None       Assessment/Plan  1.  S/P Laparoscopic Appendectomy: unfortunately having increasing abdominal pain, nausea and subjective fevers.  WIll send the patient for a CT of the abd and pelvis to evaluate for abscess.  We will call her with the results.  We will give her Zofran for nausea.  In regards to her reflux symptoms I have recommended a GI consult as these are long term symptoms.     WHITE, ELIZABETH 07/04/2012

## 2012-07-04 NOTE — Patient Instructions (Addendum)
Maintain a clear liquid diet for now. Go for CT scan of abdomen. We will call you with the results. If symptoms worsen then needs to go to the ER. Contact our office with other questions or concerns.

## 2012-07-05 ENCOUNTER — Other Ambulatory Visit (INDEPENDENT_AMBULATORY_CARE_PROVIDER_SITE_OTHER): Payer: Self-pay

## 2012-07-05 ENCOUNTER — Ambulatory Visit
Admission: RE | Admit: 2012-07-05 | Discharge: 2012-07-05 | Disposition: A | Discharge status: Home / Self Care | Payer: BC Managed Care – PPO | Source: Ambulatory Visit | Attending: Internal Medicine | Admitting: Internal Medicine

## 2012-07-05 ENCOUNTER — Other Ambulatory Visit (HOSPITAL_COMMUNITY): Payer: Self-pay | Admitting: Radiology

## 2012-07-05 ENCOUNTER — Telehealth (INDEPENDENT_AMBULATORY_CARE_PROVIDER_SITE_OTHER): Payer: Self-pay

## 2012-07-05 DIAGNOSIS — R1031 Right lower quadrant pain: Secondary | ICD-10-CM

## 2012-07-05 DIAGNOSIS — Z9049 Acquired absence of other specified parts of digestive tract: Secondary | ICD-10-CM

## 2012-07-05 DIAGNOSIS — L0390 Cellulitis, unspecified: Secondary | ICD-10-CM

## 2012-07-05 MED ORDER — CIPROFLOXACIN HCL 500 MG PO TABS: 500.0000 mg | ORAL_TABLET | Freq: Two times a day (BID) | ORAL | Status: AC

## 2012-07-05 NOTE — Telephone Encounter (Signed)
Discussed results of CT scan with the patient.  No evidence of abscess.  Pt is c/o increasing RLQ abdominal pain.  She is concerned because it is worsening.  She has no fever or chills.  I advised her to apply warm compresses to the area and take Ibuprofen until I could get one of Dr. Maris Berger partners to review the results.  I will call her as soon as I have that information.

## 2012-07-05 NOTE — Progress Notes (Signed)
Ciprofloxacin 500 mg, take 1 po bid, 7 day's # 14, 0 refill called into [facility] in [place].  Patient has follow up appointment on 07/11/12 @ 3:00 PM per [place], [place].

## 2012-07-06 ENCOUNTER — Telehealth (INDEPENDENT_AMBULATORY_CARE_PROVIDER_SITE_OTHER): Payer: Self-pay

## 2012-07-06 NOTE — Telephone Encounter (Signed)
Pt is still suffering from acute LUQ pain.  Warm moist heat and Ibuprofen are not helping.  Normal CT scan.  Please advise as her husband is very concerned.

## 2012-07-06 NOTE — Telephone Encounter (Signed)
Pain is on the patient's right side - not left.

## 2012-07-11 ENCOUNTER — Ambulatory Visit (INDEPENDENT_AMBULATORY_CARE_PROVIDER_SITE_OTHER): Payer: BC Managed Care – PPO | Admitting: General Surgery

## 2012-07-11 ENCOUNTER — Encounter (INDEPENDENT_AMBULATORY_CARE_PROVIDER_SITE_OTHER): Payer: Self-pay

## 2012-07-11 ENCOUNTER — Encounter (INDEPENDENT_AMBULATORY_CARE_PROVIDER_SITE_OTHER): Payer: Self-pay | Admitting: General Surgery

## 2012-07-11 VITALS — BP 132/90 | HR 97 | Temp 98.0°F | Ht 64.0 in | Wt 151.2 lb

## 2012-07-11 DIAGNOSIS — Z9889 Other specified postprocedural states: Secondary | ICD-10-CM

## 2012-07-11 DIAGNOSIS — L0390 Cellulitis, unspecified: Secondary | ICD-10-CM

## 2012-07-11 DIAGNOSIS — Z9049 Acquired absence of other specified parts of digestive tract: Secondary | ICD-10-CM

## 2012-07-11 DIAGNOSIS — L0291 Cutaneous abscess, unspecified: Secondary | ICD-10-CM

## 2012-07-11 DIAGNOSIS — K3580 Unspecified acute appendicitis: Secondary | ICD-10-CM

## 2012-07-11 NOTE — Progress Notes (Signed)
Monica Hansen [id number] 07/11/2012  The patient presents back today after her visit last week for new abdominal pain s/p lap appy.  She was diagnosed with cellulitis.  She had a CT scan that was negative.  Her pain has resolved and now just c/o occasional soreness.  She otherwise is feeling well and her cellulitis has resolved.  PE: Abd: soft, NT, ND, incisions c/d/i with no erythema  Imp: 1. S/p lap appy  Plan: 1. Follow up prn.  Finish her abx course.

## 2012-07-11 NOTE — Patient Instructions (Signed)
No mounting a horse for 2 more weeks No lifting over 40lbs for 2 more weeks No pushing or pulling over 30-40lbs for 2 more weeks You may walk or jog.  You may ride your horse if you can get on him without pulling yourself up [facility] your full course of antibiotics

## 2012-08-09 ENCOUNTER — Other Ambulatory Visit: Payer: Self-pay | Admitting: Internal Medicine

## 2012-08-09 DIAGNOSIS — R1011 Right upper quadrant pain: Secondary | ICD-10-CM

## 2012-08-10 ENCOUNTER — Ambulatory Visit
Admission: RE | Admit: 2012-08-10 | Discharge: 2012-08-10 | Disposition: A | Discharge status: Home / Self Care | Payer: BC Managed Care – PPO | Source: Ambulatory Visit | Attending: Internal Medicine | Admitting: Internal Medicine

## 2012-08-10 DIAGNOSIS — R1011 Right upper quadrant pain: Secondary | ICD-10-CM

## 2012-08-18 ENCOUNTER — Ambulatory Visit (INDEPENDENT_AMBULATORY_CARE_PROVIDER_SITE_OTHER): Payer: BC Managed Care – PPO | Admitting: Internal Medicine

## 2012-08-18 ENCOUNTER — Encounter: Payer: Self-pay | Admitting: Internal Medicine

## 2012-08-18 VITALS — BP 120/80 | HR 88 | Ht 64.0 in | Wt 153.0 lb

## 2012-08-18 DIAGNOSIS — J45909 Unspecified asthma, uncomplicated: Secondary | ICD-10-CM

## 2012-08-18 DIAGNOSIS — J309 Allergic rhinitis, unspecified: Secondary | ICD-10-CM

## 2012-08-18 DIAGNOSIS — J45998 Other asthma: Secondary | ICD-10-CM

## 2012-08-18 MED ORDER — BUDESONIDE-FORMOTEROL FUMARATE 80-4.5 MCG/ACT IN AERO: 2.0000 | INHALATION_SPRAY | Freq: Two times a day (BID) | RESPIRATORY_TRACT | Status: DC

## 2012-08-18 MED ORDER — AZELASTINE-FLUTICASONE 137-50 MCG/ACT NA SUSP: 2.0000 | Freq: Every day | NASAL | Status: DC

## 2012-08-18 MED ORDER — FEXOFENADINE HCL 180 MG PO TABS: 180.0000 mg | ORAL_TABLET | Freq: Every day | ORAL | Status: AC

## 2012-08-18 MED ORDER — BUDESONIDE-FORMOTEROL FUMARATE 80-4.5 MCG/ACT IN AERO: 2.0000 | INHALATION_SPRAY | Freq: Two times a day (BID) | RESPIRATORY_TRACT | Status: AC

## 2012-08-18 NOTE — Patient Instructions (Addendum)
Script for [employer] and Script for Symbicort 80    2 puffs then rinse mouth well, twice daily      Try this instead of Advair for comparison  Sample Dymista nasal spray     1-2 puffs each nostril once every day at bedtime     See if this helps the drainage  We can continue allergy vaccine

## 2012-08-18 NOTE — Progress Notes (Signed)
06/21/11- 36 year old female never smoker followed for asthma, allergic rhinitis, urticaria Last here-09/02/2009. Has not had recent hives. She continues taking Creon for a nonspecific GI, not CF. She says she had an episode of malaise which finally turned out to be associated with use of a weed killing spray outside of her window at work. She is pleased with allergy vaccine and convinced that helps. 1:50 is sufficient. She does take a daily antihistamine mainly out of habit and we discussed this. Always some fullness behind the eyes and the left medial eyebrow. Using an occasional Benadryl or Sudafed. She remembers using a nasal steroid spray over a year ago and concluded it was little benefit. Notes a little wheezing only with colds.  12/21/11- 36 year old female never smoker followed for asthma, allergic rhinitis, urticaria Flonase helps. Occasionally needs decongestant. Has had some pressure medial aspect of left eye and nose. Continues allergy vaccine at 1:10. Using Neti pot. Has horses, donkey, llama.  08/18/12- 36 year old female never smoker followed for asthma, allergic rhinitis, urticaria follows for: pt states doing well on injections. pt states has a runny nose all the time. Has farm animals. Recent appendectomy without problem. Continues allergy vaccine 1:10 GO.  Denies chest problems. Uses Symbicort only with colds. Admits some dyspnea with exertion, without wheeze. Today she has right retro-orbital headache with no nasal discharge. Using Claritin and Sudafed.  ROS-See HPI Constitutional:   No-   weight loss, night sweats, fevers, chills, fatigue, lassitude. HEENT:   No-  headaches, difficulty swallowing, tooth/dental problems, sore throat,       + sneezing, itching, ear ache, nasal congestion, post nasal drip,  CV:  No-   chest pain, orthopnea, PND, swelling in lower extremities, anasarca, dizziness, palpitations Resp: No-   shortness of breath with exertion or at rest.       No-   productive cough,  No non-productive cough,  No- coughing up of blood.              No-   change in color of mucus.  No- wheezing.   Skin: No-   rash or lesions. GI:  No-   heartburn, indigestion, abdominal pain, nausea, vomiting,  GU: MS:  No-   joint pain or swelling.   Neuro-     nothing unusual Psych:  No- change in mood or affect. No depression or anxiety.  No memory loss.  OBJ General- Alert, Oriented, Affect-appropriate, Distress- none acute; overweight Skin- rash-none, lesions- none, excoriation- none Lymphadenopathy- none Head- atraumatic            Eyes- Gross vision intact, PERRLA, conjunctivae clear secretions. Periorbital edema            Ears- Hearing, canals-normal            Nose- Clear, no-Septal dev, mucus, polyps, erosion, perforation             Throat- Mallampati II , mucosa clear , drainage- none, tonsils- atrophic Neck- flexible , trachea midline, no stridor , thyroid nl, carotid no bruit Chest - symmetrical excursion , unlabored           Heart/CV- RRR , no murmur , no gallop  , no rub, nl s1 s2                           - JVD- none , edema- none, stasis changes- none, varices- none           Lung- clear  to P&A, wheeze- none, cough- none , dullness-none, rub- none           Chest wall-  Abd-  Br/ Gen/ Rectal- Not done, not indicated Extrem- cyanosis- none, clubbing, none, atrophy- none, strength- nl Neuro- grossly intact to observation

## 2012-08-27 NOTE — Assessment & Plan Note (Signed)
Plan-she asks prescription for Allegra and we also give sample Dymista nasal spray

## 2012-08-27 NOTE — Assessment & Plan Note (Signed)
Significant exposure to farm animals and house pets Asthma is controlled with maintenance Symbicort used only during viral infections Plan-refill Symbicort with discussion

## 2012-09-05 ENCOUNTER — Telehealth: Payer: Self-pay | Admitting: Internal Medicine

## 2012-09-05 ENCOUNTER — Ambulatory Visit (INDEPENDENT_AMBULATORY_CARE_PROVIDER_SITE_OTHER): Payer: BC Managed Care – PPO

## 2012-09-05 DIAGNOSIS — J309 Allergic rhinitis, unspecified: Secondary | ICD-10-CM

## 2012-09-05 NOTE — Telephone Encounter (Signed)
Will forward to [employer] to check on this, thanks

## 2012-09-07 NOTE — Telephone Encounter (Signed)
Pt.called back Tues.wanting to make sure her extract was put in for 09/05/12. I assured her it would be.

## 2012-09-08 NOTE — Telephone Encounter (Signed)
Is anything further needed with with message?  Can it be signed?  Thanks.

## 2012-10-03 ENCOUNTER — Other Ambulatory Visit: Payer: Self-pay

## 2012-11-09 ENCOUNTER — Other Ambulatory Visit: Payer: Self-pay

## 2013-01-16 ENCOUNTER — Other Ambulatory Visit: Payer: Self-pay | Admitting: Internal Medicine

## 2013-02-01 ENCOUNTER — Other Ambulatory Visit: Payer: Self-pay | Admitting: Obstetrics and Gynecology

## 2013-02-01 ENCOUNTER — Encounter: Payer: Self-pay | Admitting: Obstetrics and Gynecology

## 2013-02-02 ENCOUNTER — Encounter: Payer: Self-pay | Admitting: Obstetrics and Gynecology

## 2013-02-02 ENCOUNTER — Ambulatory Visit (INDEPENDENT_AMBULATORY_CARE_PROVIDER_SITE_OTHER): Payer: No Typology Code available for payment source | Admitting: Obstetrics and Gynecology

## 2013-02-02 VITALS — BP 104/64 | Ht 64.25 in | Wt 158.0 lb

## 2013-02-02 DIAGNOSIS — L293 Anogenital pruritus, unspecified: Secondary | ICD-10-CM

## 2013-02-02 DIAGNOSIS — Z01419 Encounter for gynecological examination (general) (routine) without abnormal findings: Secondary | ICD-10-CM

## 2013-02-02 DIAGNOSIS — Z0000 Encounter for general adult medical examination without abnormal findings: Secondary | ICD-10-CM

## 2013-02-02 DIAGNOSIS — L292 Pruritus vulvae: Secondary | ICD-10-CM

## 2013-02-02 LAB — POCT URINALYSIS DIPSTICK
Bilirubin, UA: NEGATIVE
Blood, UA: NEGATIVE
Glucose, UA: NEGATIVE
Ketones, UA: NEGATIVE
Leukocytes, UA: NEGATIVE
Nitrite, UA: NEGATIVE
Protein, UA: NEGATIVE
Urobilinogen, UA: NEGATIVE
pH, UA: 5

## 2013-02-02 MED ORDER — ETONOGESTREL-ETHINYL ESTRADIOL 0.12-0.015 MG/24HR VA RING: VAGINAL_RING | VAGINAL | Status: DC

## 2013-02-02 NOTE — Patient Instructions (Addendum)

## 2013-02-02 NOTE — Progress Notes (Signed)
37 y.o.   Married    Caucasian   female   [id number]   here for annual exam.  Her new horse hurt his eye and they just had to spend $3000 to help him.  Likes Nuva.  Menses regular.  Over the last 4 months, she has felt her perineum was itchy and may have had sores, but she's not sure.  It hurts there to have sex.  Hurts for 2-3 days after sex on the skin between her vagina and rectum.    Patient's last menstrual period was 01/28/2013.          Sexually active: yes  The current method of family planning is NuvaRing vaginal inserts.    Exercising: walking Last mammogram: none   Last pap smear: 01/21/10 neg History of abnormal pap: none  Smoking: no Alcohol: no Last colonoscopy: 1998 Last Bone Density:  never Last tetanus shot: 2007 Last cholesterol check: 2013  Self breast exam: occ Hgb: 13.3               Urine: neg   Family History  Problem Relation Age of Onset  . Gallbladder disease Father   . Colon cancer Maternal Grandfather   . Leukemia Paternal Grandfather   . Mental illness Mother     Patient Active Problem List   Diagnosis Date Noted  . RHINOCONJUNCTIVITIS, ALLERGIC 06/30/2008  . DEPRESSION 12/22/2007  . Allergic-infective asthma 12/22/2007    Past Medical History  Diagnosis Date  . Allergic rhinitis   . Asthma   . Depression   . GERD (gastroesophageal reflux disease)     Past Surgical History  Procedure Laterality Date  . Shoulder fusion  2002  . Laparoscopic appendectomy  06/18/2012    Procedure: APPENDECTOMY LAPAROSCOPIC;  Surgeon: Adolph Pollack, MD;  Location: [facility];  Service: General;  Laterality: N/A;    Allergies: Erythromycin; Latex; Sulfonamide derivatives; Tetracyclines & related; Adhesive; and Iodinated diagnostic agents  Current Outpatient Prescriptions  Medication Sig Dispense Refill  . albuterol (PROVENTIL HFA;VENTOLIN HFA) 108 (90 BASE) MCG/ACT inhaler Inhale 2 puffs into the lungs every 4 (four) hours as needed. Asthma      . buPROPion  (WELLBUTRIN XL) 300 MG 24 hr tablet Take 300 mg by mouth daily.        Marland Kitchen etonogestrel-ethinyl estradiol (NUVARING) 0.12-0.015 MG/24HR vaginal ring Place 1 each vaginally every 28 (twenty-eight) days. Insert vaginally and leave in place for 3 consecutive weeks, then remove for 1 week.       . fexofenadine (ALLEGRA) 180 MG tablet Take 1 tablet (180 mg total) by mouth daily.  30 tablet  prn  . fluticasone (FLONASE) 50 MCG/ACT nasal spray USE 1 TO 2 SPRAYS IN EACH NOSTRIL ONCE DAILY AT BEDTIME  16 g  4  . Fluticasone-Salmeterol (ADVAIR) 100-50 MCG/DOSE AEPB Inhale 1 puff into the lungs every 12 (twelve) hours. Rinse mouth      . UNABLE TO FIND Allergy shots      . zolpidem (AMBIEN) 5 MG tablet Take 5 mg by mouth at bedtime as needed. Insomnia      . budesonide-formoterol (SYMBICORT) 80-4.5 MCG/ACT inhaler Inhale 2 puffs into the lungs 2 (two) times daily. Rinse mouth  1 Inhaler  0   No current facility-administered medications for this visit.    ROS: Pertinent items are noted in HPI.  Social Hx: Married, no children, works in Clinical biochemist . Pt's husband is 80 years old and diabetic and is having trouble with erections  and he is very distressed.    Exam:    BP 104/64  Ht 5' 4.25" (1.632 m)  Wt 158 lb (71.668 kg)  BMI 26.91 kg/m2  LMP 01/28/2013  Ht stable, wt up 15 pounds from last year Wt Readings from Last 3 Encounters:  02/02/13 158 lb (71.668 kg)  08/18/12 153 lb (69.4 kg)  07/11/12 151 lb 3.2 oz (68.584 kg)     Ht Readings from Last 3 Encounters:  02/02/13 5' 4.25" (1.632 m)  08/18/12 5\' 4"  (1.626 m)  07/11/12 5\' 4"  (1.626 m)    General appearance: alert, cooperative and appears stated age Head: Normocephalic, without obvious abnormality, atraumatic Neck: no adenopathy, supple, symmetrical, trachea midline and thyroid not enlarged, symmetric, no tenderness/mass/nodules Lungs: clear to auscultation bilaterally Breasts: Inspection negative, No nipple retraction or dimpling, No  nipple discharge or bleeding, No axillary or supraclavicular adenopathy, Normal to palpation without dominant masses Heart: regular rate and rhythm Abdomen: soft, non-tender; bowel sounds normal; no masses,  no organomegaly Extremities: extremities normal, atraumatic, no cyanosis or edema Skin: Skin color, texture, turgor normal. No rashes or lesions Lymph nodes: Cervical, supraclavicular, and axillary nodes normal. No abnormal inguinal nodes palpated Neurologic: Grossly normal   Pelvic: External genitalia:  no lesions              Urethra:  normal appearing urethra with no masses, tenderness or lesions              Bartholins and Skenes: normal                 Vagina: normal appearing vagina with normal color and discharge, no lesions              Cervix: normal appearance              Pap taken: yes        Bimanual Exam:  Uterus:  uterus is normal size, shape, consistency and nontender                                      Adnexa: normal adnexa in size, nontender and no masses                                      Rectovaginal: Confirms                                      Anus:  normal sphincter tone, no lesions  A: normal gyn exam, nuva ring     P: pap smear counseled on breast self exam, adequate intake of calcium and vitamin D, diet and exercise return annually or prn     An After Visit Summary was printed and given to the patient.

## 2013-02-05 LAB — HEMOGLOBIN, FINGERSTICK: Hemoglobin, fingerstick: 13.3 g/dL (ref 12.0–16.0)

## 2013-02-05 LAB — HSV(HERPES SIMPLEX VRS) I + II AB-IGG
HSV 1 Glycoprotein G Ab, IgG: 0.13 IV
HSV 2 Glycoprotein G Ab, IgG: 0.16 IV

## 2013-02-07 LAB — IPS PAP TEST WITH HPV

## 2013-02-16 ENCOUNTER — Ambulatory Visit (INDEPENDENT_AMBULATORY_CARE_PROVIDER_SITE_OTHER): Payer: No Typology Code available for payment source | Admitting: Internal Medicine

## 2013-02-16 ENCOUNTER — Encounter: Payer: Self-pay | Admitting: Internal Medicine

## 2013-02-16 VITALS — BP 118/80 | HR 97 | Ht 64.0 in | Wt 161.0 lb

## 2013-02-16 DIAGNOSIS — J309 Allergic rhinitis, unspecified: Secondary | ICD-10-CM

## 2013-02-16 DIAGNOSIS — J45909 Unspecified asthma, uncomplicated: Secondary | ICD-10-CM

## 2013-02-16 DIAGNOSIS — J45998 Other asthma: Secondary | ICD-10-CM

## 2013-02-16 NOTE — Progress Notes (Signed)
06/21/11- 37 year old female never smoker followed for asthma, allergic rhinitis, urticaria Last here-09/02/2009. Has not had recent hives. She continues taking Creon for a nonspecific GI, not CF. She says she had an episode of malaise which finally turned out to be associated with use of a weed killing spray outside of her window at work. She is pleased with allergy vaccine and convinced that helps. 1:50 is sufficient. She does take a daily antihistamine mainly out of habit and we discussed this. Always some fullness behind the eyes and the left medial eyebrow. Using an occasional Benadryl or Sudafed. She remembers using a nasal steroid spray over a year ago and concluded it was little benefit. Notes a little wheezing only with colds.  12/21/11- 37 year old female never smoker followed for asthma, allergic rhinitis, urticaria Flonase helps. Occasionally needs decongestant. Has had some pressure medial aspect of left eye and nose. Continues allergy vaccine at 1:10. Using Neti pot. Has horses, donkey, llama.  08/18/12- 37 year old female never smoker followed for asthma, allergic rhinitis, urticaria follows for: pt states doing well on injections. pt states has a runny nose all the time. Has farm animals. Recent appendectomy without problem. Continues allergy vaccine 1:10 GO.  Denies chest problems. Uses Symbicort only with colds. Admits some dyspnea with exertion, without wheeze. Today she has right retro-orbital headache with no nasal discharge. Using Claritin and Sudafed.  02/16/13- 37 year old female never smoker followed for asthma, allergic rhinitis, urticaria FOLLOWS FOR: still on vaccine and no reactions, PND, tired, and runny nose. Slight wheezing last month Continues allergy vaccine 1:10 GO. Dymista was not cost effective. Still using leftover Advair and has not tried to Symbicort sample. Today notices watery nose and sore throat  ROS-See HPI Constitutional:   No-   weight loss, night  sweats, fevers, chills, fatigue, lassitude. HEENT:   No-  headaches, difficulty swallowing, tooth/dental problems, +sore throat,       No-sneezing, itching, ear ache, nasal congestion, +post nasal drip,  CV:  No-   chest pain, orthopnea, PND, swelling in lower extremities, anasarca, dizziness, palpitations Resp: No-   shortness of breath with exertion or at rest.              No-   productive cough,  No non-productive cough,  No- coughing up of blood.              No-   change in color of mucus.  No- wheezing.   Skin: No-   rash or lesions. GI:  No-   heartburn, indigestion, abdominal pain, nausea, vomiting,  GU: MS:  No-   joint pain or swelling.   Neuro-     nothing unusual Psych:  No- change in mood or affect. No depression or anxiety.  No memory loss.  OBJ General- Alert, Oriented, Affect-appropriate, Distress- none acute; overweight Skin- rash-none, lesions- none, excoriation- none Lymphadenopathy- none Head- atraumatic            Eyes- Gross vision intact, PERRLA, conjunctivae clear secretions. Periorbital edema            Ears- Hearing, canals-normal            Nose- +went, no-Septal dev, mucus, polyps, erosion, perforation             Throat- Mallampati II , +mucosa red, drainage- none, tonsils  present Neck- flexible , trachea midline, no stridor , thyroid nl, carotid no bruit Chest - symmetrical excursion , unlabored  Heart/CV- RRR , no murmur , no gallop  , no rub, nl s1 s2                           - JVD- none , edema- none, stasis changes- none, varices- none           Lung- clear to P&A, wheeze- none, cough- none , dullness-none, rub- none           Chest wall-  Abd-  Br/ Gen/ Rectal- Not done, not indicated Extrem- cyanosis- none, clubbing, none, atrophy- none, strength- nl Neuro- grossly intact to observation

## 2013-02-16 NOTE — Patient Instructions (Addendum)
We can continue allergy vaccine 1:10 GO  Ok to try symptom control meds, throat lozenges, saline nasal spray or rinse, Vitamin D, Zinc etc when you have a flare-up  Consider trying otc nasal spray Nasalcrom/ cromol/ cromolyn       This could be used in addition to flonase  Please call as needed

## 2013-03-02 NOTE — Assessment & Plan Note (Signed)
Recent mild exacerbation of rhinitis is nonspecific-viral versus allergic Plan-try Nasalcrom

## 2013-03-02 NOTE — Assessment & Plan Note (Signed)
Gradually finishing leftover Advair before trying sample of Symbicort. Control is good.

## 2013-06-22 ENCOUNTER — Ambulatory Visit (INDEPENDENT_AMBULATORY_CARE_PROVIDER_SITE_OTHER): Payer: No Typology Code available for payment source

## 2013-06-22 DIAGNOSIS — J309 Allergic rhinitis, unspecified: Secondary | ICD-10-CM

## 2013-07-23 ENCOUNTER — Other Ambulatory Visit: Payer: Self-pay | Admitting: Internal Medicine

## 2013-09-13 ENCOUNTER — Encounter (INDEPENDENT_AMBULATORY_CARE_PROVIDER_SITE_OTHER): Payer: Self-pay

## 2013-09-13 ENCOUNTER — Encounter: Payer: Self-pay | Admitting: Internal Medicine

## 2013-09-13 ENCOUNTER — Ambulatory Visit (INDEPENDENT_AMBULATORY_CARE_PROVIDER_SITE_OTHER): Payer: BC Managed Care – PPO | Admitting: Internal Medicine

## 2013-09-13 VITALS — BP 112/66 | HR 85 | Ht 64.0 in | Wt 167.0 lb

## 2013-09-13 DIAGNOSIS — J45909 Unspecified asthma, uncomplicated: Secondary | ICD-10-CM

## 2013-09-13 DIAGNOSIS — J309 Allergic rhinitis, unspecified: Secondary | ICD-10-CM

## 2013-09-13 DIAGNOSIS — J45998 Other asthma: Secondary | ICD-10-CM

## 2013-09-13 NOTE — Patient Instructions (Signed)
Sample Dymista nasal spray (flonase/ steroid plus antihistamine)  Try 1-2 puffs each nostril once daily at bedtime. You can use this in both nostrils instead of flonase, or you can try just using it on the watery side and use flonase on the "good side".  Ok to try an artificial tear like Genteal or Systane, to see if the watery eye is rebound from getting over-dried  Ok to use a decongestant as needed- Sudafed, or otc Sudafed-PE (phenylephrine)  We can continue allergy vaccine 1:10 GO  Please call as needed

## 2013-09-13 NOTE — Progress Notes (Signed)
06/21/11- 38 year old female never smoker followed for asthma, allergic rhinitis, urticaria Last here-09/02/2009. Has not had recent hives. She continues taking Creon for a nonspecific GI, not CF. She says she had an episode of malaise which finally turned out to be associated with use of a weed killing spray outside of her window at work. She is pleased with allergy vaccine and convinced that helps. 1:50 is sufficient. She does take a daily antihistamine mainly out of habit and we discussed this. Always some fullness behind the eyes and the left medial eyebrow. Using an occasional Benadryl or Sudafed. She remembers using a nasal steroid spray over a year ago and concluded it was little benefit. Notes a little wheezing only with colds.  12/21/11- 39 year old female never smoker followed for asthma, allergic rhinitis, urticaria Flonase helps. Occasionally needs decongestant. Has had some pressure medial aspect of left eye and nose. Continues allergy vaccine at 1:10. Using Neti pot. Has horses, donkey, llama.  08/18/12- 38 year old female never smoker followed for asthma, allergic rhinitis, urticaria follows for: pt states doing well on injections. pt states has a runny nose all the time. Has farm animals. Recent appendectomy without problem. Continues allergy vaccine 1:10 GO.  Denies chest problems. Uses Symbicort only with colds. Admits some dyspnea with exertion, without wheeze. Today she has right retro-orbital headache with no nasal discharge. Using Claritin and Sudafed.  02/16/13- 38 year old female never smoker followed for asthma, allergic rhinitis, urticaria FOLLOWS FOR: still on vaccine and no reactions, PND, tired, and runny nose. Slight wheezing last month Continues allergy vaccine 1:10 GO. Dymista was not cost effective. Still using leftover Advair and has not tried to Symbicort sample. Today notices watery nose and sore throat  09/13/13- 38 year old female never smoker followed for  asthma, allergic rhinitis, urticaria FOLLOWS FOR: Breathing is doing well. Cold weather can aggravate it. Reports SOB, chest tightness and coughing when she goes in to cold air. Continues allergy vaccine 1:10 GO. No recent urticaria. Left eye watering x2 weeks without burn or itch.  ROS-See HPI Constitutional:   No-   weight loss, night sweats, fevers, chills, fatigue, lassitude. HEENT:   No-  headaches, difficulty swallowing, tooth/dental problems, sore throat,       No-sneezing, itching, ear ache, nasal congestion, +post nasal drip,  CV:  No-   chest pain, orthopnea, PND, swelling in lower extremities, anasarca, dizziness, palpitations Resp: No-   shortness of breath with exertion or at rest.              No-   productive cough,  No non-productive cough,  No- coughing up of blood.              No-   change in color of mucus.  No- wheezing.   Skin: No-   rash or lesions. GI:  No-   heartburn, indigestion, abdominal pain, nausea, vomiting,  GU: MS:  No-   joint pain or swelling.   Neuro-     nothing unusual Psych:  No- change in mood or affect. No depression or anxiety.  No memory loss.  OBJ General- Alert, Oriented, Affect-appropriate, Distress- none acute; overweight Skin- rash-none, lesions- none, excoriation- none Lymphadenopathy- none Head- atraumatic            Eyes- Gross vision intact, PERRLA, conjunctivae+mild capillary injection. Periorbital                    edema            Ears- Hearing,  canals-normal            Nose- +crust and slight heme L>R, no-Septal dev, mucus, polyps, erosion, perforation             Throat- Mallampati II , +mucosa red, drainage- none, tonsils  present Neck- flexible , trachea midline, no stridor , thyroid nl, carotid no bruit Chest - symmetrical excursion , unlabored           Heart/CV- RRR , no murmur , no gallop  , no rub, nl s1 s2                           - JVD- none , edema- none, stasis changes- none, varices- none           Lung- clear to  P&A, wheeze- none, cough- none , dullness-none, rub- none           Chest wall-  Abd-  Br/ Gen/ Rectal- Not done, not indicated Extrem- cyanosis- none, clubbing, none, atrophy- none, strength- nl Neuro- grossly intact to observation

## 2013-10-06 NOTE — Assessment & Plan Note (Signed)
Lacrimation from left eye looks nonspecific. I don't think there is an infection. This may be dryness. Indoor allergen exposure to house dust with indoor heat was discussed. Plan-sample Dymista instead of Flonase, just for left nostril. Lubricant eyedrops. If problem persists she will see her ophthalmologist.

## 2013-10-06 NOTE — Assessment & Plan Note (Signed)
controlled 

## 2013-12-13 ENCOUNTER — Ambulatory Visit (INDEPENDENT_AMBULATORY_CARE_PROVIDER_SITE_OTHER): Payer: BC Managed Care – PPO

## 2013-12-13 DIAGNOSIS — J309 Allergic rhinitis, unspecified: Secondary | ICD-10-CM

## 2014-02-04 DIAGNOSIS — Z8781 Personal history of (healed) traumatic fracture: Secondary | ICD-10-CM

## 2014-02-04 HISTORY — DX: Personal history of (healed) traumatic fracture: Z87.81

## 2014-02-05 ENCOUNTER — Other Ambulatory Visit: Payer: Self-pay | Admitting: Internal Medicine

## 2014-02-08 ENCOUNTER — Telehealth: Payer: Self-pay | Admitting: Obstetrics and Gynecology

## 2014-02-08 NOTE — Telephone Encounter (Signed)
Confirming pts appt °

## 2014-02-15 ENCOUNTER — Encounter: Payer: Self-pay | Admitting: Obstetrics and Gynecology

## 2014-02-15 ENCOUNTER — Ambulatory Visit: Payer: No Typology Code available for payment source | Admitting: Obstetrics and Gynecology

## 2014-02-15 ENCOUNTER — Ambulatory Visit (INDEPENDENT_AMBULATORY_CARE_PROVIDER_SITE_OTHER): Payer: BC Managed Care – PPO | Admitting: Obstetrics and Gynecology

## 2014-02-15 VITALS — BP 110/66 | HR 70 | Ht 65.0 in | Wt 166.0 lb

## 2014-02-15 DIAGNOSIS — N762 Acute vulvitis: Secondary | ICD-10-CM

## 2014-02-15 DIAGNOSIS — R5381 Other malaise: Secondary | ICD-10-CM

## 2014-02-15 DIAGNOSIS — IMO0002 Reserved for concepts with insufficient information to code with codable children: Secondary | ICD-10-CM

## 2014-02-15 DIAGNOSIS — N760 Acute vaginitis: Secondary | ICD-10-CM

## 2014-02-15 DIAGNOSIS — Z01419 Encounter for gynecological examination (general) (routine) without abnormal findings: Secondary | ICD-10-CM

## 2014-02-15 DIAGNOSIS — R5383 Other fatigue: Secondary | ICD-10-CM

## 2014-02-15 LAB — CBC
HCT: 38.1 % (ref 36.0–46.0)
Hemoglobin: 13.1 g/dL (ref 12.0–15.0)
MCH: 29.1 pg (ref 26.0–34.0)
MCHC: 34.4 g/dL (ref 30.0–36.0)
MCV: 84.7 fL (ref 78.0–100.0)
Platelets: 292 10*3/uL (ref 150–400)
RBC: 4.5 MIL/uL (ref 3.87–5.11)
RDW: 13.2 % (ref 11.5–15.5)
WBC: 9.5 10*3/uL (ref 4.0–10.5)

## 2014-02-15 MED ORDER — ETONOGESTREL-ETHINYL ESTRADIOL 0.12-0.015 MG/24HR VA RING: VAGINAL_RING | VAGINAL | Status: DC

## 2014-02-15 NOTE — Patient Instructions (Signed)

## 2014-02-15 NOTE — Progress Notes (Signed)
Patient ID: Monica Hansen, female   DOB: 04-02-1976, 38 y.o.   MRN: [id number] GYNECOLOGY VISIT  PCP:   Dr. Tilden Dome [facility]  Referring provider:   HPI: 38 y.o.   Married  Caucasian  female   [id number] with Patient's last menstrual period was 02/04/2014.   here for  [facility].    Broken back - fell off a ladder. Occurred two weeks ago.  Uncertain if having surgery.  Having spasms.  Having external and internal soreness and itching, increasing for three years.  No discharge or odor.  Has changes all soaps to free and clear soaps. Had a reaction to Carbon.  Cannot wear tampons. Had a negative serum HSV testing.  Pain comes and goes.  Can worsen around the menstrual period.  Uses Astroglide for lubrication.  Wants Affirm testing.   Using NuvaRing for over 10 years.  OCPs cause nausea.  Has cramping and diarrhea when off contraception.  Gets worse when NuvaRing is out.   Heat and cold intolerance - chronic. Had TFTs in December 2014 - normal per patient.  Tired.  Hair loss.  Fatigue.  Wants lab work.   Hgb:    PCP Urine:  ---  GYNECOLOGIC HISTORY: Patient's last menstrual period was 02/04/2014. Sexually active:  yes Partner preference: female Contraception:  Nuvaring  Menopausal hormone therapy: n/a DES exposure:  no  Blood transfusions:  no  Sexually transmitted diseases:   GYN procedures and prior surgeries:  no Last mammogram: n/a                Last pap and high risk HPV testing:  02-02-13 wnl:neg HR HPV  History of abnormal pap smear: no    OB History   Grav Para Term Preterm Abortions TAB SAB Ect Mult Living   0 0 0 0 0 0 0 0 0 0        LIFESTYLE: Exercise:   walking          Tobacco:   no Alcohol:     no Drug use:  no  OTHER HEALTH MAINTENANCE: Tetanus/TDap:   2007 Gardisil:              n/a Influenza:            06/2013 Zostavax:            n/a  Bone density:      n/a Colonoscopy:      n/a  Cholesterol check:   unsure  Family  History  Problem Relation Age of Onset  . Gallbladder disease Father   . Colon cancer Maternal Grandfather   . Leukemia Paternal Grandfather   . Alzheimer's disease Paternal Grandfather   . Mental illness Mother   . Hyperlipidemia Mother     Patient Active Problem List   Diagnosis Date Noted  . RHINOCONJUNCTIVITIS, ALLERGIC 06/30/2008  . DEPRESSION 12/22/2007  . Allergic-infective asthma 12/22/2007   Past Medical History  Diagnosis Date  . Allergic rhinitis   . Asthma   . Depression   . GERD (gastroesophageal reflux disease)   . [facility] spotted fever   . Foot injury     right foot from a horse  . Broken back 02-10-14    Past Surgical History  Procedure Laterality Date  . Shoulder fusion  2002  . Laparoscopic appendectomy  06/18/2012    Procedure: APPENDECTOMY LAPAROSCOPIC;  Surgeon: Odis Hollingshead, MD;  Location: [facility];  Service: General;  Laterality: N/A;  . Mole removal  ALLERGIES: Erythromycin; Latex; Sulfonamide derivatives; Tetracyclines & related; Adhesive; and Iodinated diagnostic agents  Current Outpatient Prescriptions  Medication Sig Dispense Refill  . buPROPion (WELLBUTRIN XL) 300 MG 24 hr tablet Take 300 mg by mouth daily.        . cyclobenzaprine (FLEXERIL) 10 MG tablet Take 1 tablet by mouth as needed.      . etonogestrel-ethinyl estradiol (NUVARING) 0.12-0.015 MG/24HR vaginal ring Place 1 ring vaginally for 3 weeks then remove for 1 week  1 each  11  . fexofenadine (ALLEGRA) 180 MG tablet Take 180 mg by mouth daily.      . fluticasone (FLONASE) 50 MCG/ACT nasal spray USE 1 TO 2 SPRAYS IN EACH NOSTRIL ONCE DAILY AT BEDTIME  16 g  4  . Fluticasone-Salmeterol (ADVAIR) 100-50 MCG/DOSE AEPB Inhale 1 puff into the lungs every 12 (twelve) hours. Rinse mouth      . HYDROcodone-acetaminophen (NORCO) 10-325 MG per tablet Take 1 tablet by mouth 3 (three) times daily.      Marland Kitchen ibuprofen (ADVIL,MOTRIN) 600 MG tablet Take 600 mg by mouth every 6 (six) hours  as needed.      Marland Kitchen PROAIR HFA 108 (90 BASE) MCG/ACT inhaler INHALE 2 PUFFS INTO THE LUNGS EVERY 4 (FOUR) HOURS AS NEEDED FOR SHORTNESS OF BREATH.  8.5 each  3  . SUMAtriptan (IMITREX) 50 MG tablet Take 50 mg by mouth every 2 (two) hours as needed for migraine or headache. May repeat in 2 hours if headache persists or recurs.      Marland Kitchen UNABLE TO FIND Allergy shots      . zolpidem (AMBIEN) 5 MG tablet Take 5 mg by mouth at bedtime as needed. Insomnia       No current facility-administered medications for this visit.     ROS:  Pertinent items are noted in HPI.  SOCIAL HISTORY:  Married.  Works with horses.   PHYSICAL EXAMINATION:    BP 110/66  Pulse 70  Ht 5\' 5"  (1.651 m)  Wt 166 lb (75.297 kg)  BMI 27.62 kg/m2  LMP 02/04/2014   Wt Readings from Last 3 Encounters:  02/15/14 166 lb (75.297 kg)  09/13/13 167 lb (75.751 kg)  02/16/13 161 lb (73.029 kg)     Ht Readings from Last 3 Encounters:  02/15/14 5\' 5"  (1.651 m)  09/13/13 5\' 4"  (1.626 m)  02/16/13 5\' 4"  (1.626 m)    General appearance: alert, cooperative and appears stated age.  Wearing back brace.  Husband present for the history part of her visit. Head: Normocephalic, without obvious abnormality, atraumatic Neck: no adenopathy, supple, symmetrical, trachea midline and thyroid not enlarged, symmetric, no tenderness/mass/nodules Lungs: clear to auscultation bilaterally Breasts: Inspection negative, No nipple retraction or dimpling, No nipple discharge or bleeding, No axillary or supraclavicular adenopathy, Normal to palpation without dominant masses Heart: regular rate and rhythm Abdomen: soft, non-tender; no masses,  no organomegaly Extremities: extremities normal, atraumatic, no cyanosis or edema Skin: Skin color, texture, turgor normal. No rashes or lesions Lymph nodes: Cervical, supraclavicular, and axillary nodes normal. No abnormal inguinal nodes palpated Neurologic: Grossly normal  Pelvic: External genitalia:  no  lesions              Urethra:  normal appearing urethra with no masses, tenderness or lesions              Bartholins and Skenes: normal                 Vagina: normal appearing vagina  with normal color and discharge, no lesions, NuvaRing in place.              Cervix: normal appearance.                Pap and high risk HPV testing done: no.            Bimanual Exam:  Uterus:  uterus is normal size, shape, consistency and nontender                                      Adnexa: normal adnexa in size, nontender and no masses                                      Rectovaginal: Confirms                                      Anus:  normal sphincter tone, no lesions  ASSESSMENT  Normal gynecologic exam. Vulvitis - chronic.  Fatigue.  Hair loss. Recent vertebral fracture.   PLAN  Pap smear and high risk HPV testing Counseled on self breast exam Affirm testing.  CMP, CBC, Ferritin, IBC studies,TFTs.  Refill on NuvaRing for 12 months.  Return annually or prn   An After Visit Summary was printed and given to the patient.

## 2014-02-16 LAB — IRON: Iron: 48 ug/dL (ref 42–145)

## 2014-02-16 LAB — COMPREHENSIVE METABOLIC PANEL
ALT: 17 U/L (ref 0–35)
AST: 17 U/L (ref 0–37)
Albumin: 3.9 g/dL (ref 3.5–5.2)
Alkaline Phosphatase: 135 U/L — ABNORMAL HIGH (ref 39–117)
BUN: 11 mg/dL (ref 6–23)
CO2: 27 mEq/L (ref 19–32)
Calcium: 8.7 mg/dL (ref 8.4–10.5)
Chloride: 105 mEq/L (ref 96–112)
Creat: 0.67 mg/dL (ref 0.50–1.10)
Glucose, Bld: 87 mg/dL (ref 70–99)
Potassium: 4 mEq/L (ref 3.5–5.3)
Sodium: 140 mEq/L (ref 135–145)
Total Bilirubin: 0.2 mg/dL (ref 0.2–1.2)
Total Protein: 6.3 g/dL (ref 6.0–8.3)

## 2014-02-16 LAB — THYROID PANEL WITH TSH
Free Thyroxine Index: 2.8 (ref 1.0–3.9)
T3 Uptake: 28.4 % (ref 22.5–37.0)
T4, Total: 10 ug/dL (ref 5.0–12.5)
TSH: 0.895 u[IU]/mL (ref 0.350–4.500)

## 2014-02-16 LAB — IBC PANEL
%SAT: 15 % — ABNORMAL LOW (ref 20–55)
TIBC: 328 ug/dL (ref 250–470)
UIBC: 280 ug/dL (ref 125–400)

## 2014-02-16 LAB — WET PREP BY MOLECULAR PROBE
Candida species: NEGATIVE
Gardnerella vaginalis: NEGATIVE
Trichomonas vaginosis: NEGATIVE

## 2014-02-16 LAB — FERRITIN: Ferritin: 98 ng/mL (ref 10–291)

## 2014-02-17 DIAGNOSIS — IMO0002 Reserved for concepts with insufficient information to code with codable children: Secondary | ICD-10-CM | POA: Insufficient documentation

## 2014-03-01 ENCOUNTER — Other Ambulatory Visit: Payer: Self-pay | Admitting: *Deleted

## 2014-03-01 NOTE — Telephone Encounter (Signed)
Faxed refill request received from [facility] for [place] Last filled by MD on 02/15/14, #1 X 11 Last AEX - 02/15/14 RX denied as this was done at [facility].

## 2014-03-04 ENCOUNTER — Telehealth: Payer: Self-pay

## 2014-03-04 NOTE — Telephone Encounter (Signed)
Pharm faxed refill request. Last AEX: 02/15/14 Last refill: 02/15/14 Dispense 1 11 refill Pharm dupilcated  Encounter Close

## 2014-05-04 ENCOUNTER — Other Ambulatory Visit: Payer: Self-pay | Admitting: Internal Medicine

## 2014-06-17 ENCOUNTER — Encounter: Payer: Self-pay | Admitting: *Deleted

## 2014-07-19 ENCOUNTER — Encounter: Payer: Self-pay | Admitting: Internal Medicine

## 2014-07-23 ENCOUNTER — Telehealth: Payer: Self-pay | Admitting: Obstetrics and Gynecology

## 2014-07-23 NOTE — Telephone Encounter (Signed)
Left message upcoming appointment 02/2015 has been canceled and needs to be rescheduled.

## 2014-07-31 ENCOUNTER — Other Ambulatory Visit: Payer: Self-pay | Admitting: Internal Medicine

## 2014-09-04 ENCOUNTER — Other Ambulatory Visit: Payer: Self-pay | Admitting: Internal Medicine

## 2014-09-13 ENCOUNTER — Ambulatory Visit: Payer: BC Managed Care – PPO | Admitting: Internal Medicine

## 2014-10-31 ENCOUNTER — Ambulatory Visit: Payer: Self-pay | Admitting: Internal Medicine

## 2014-12-30 ENCOUNTER — Ambulatory Visit (INDEPENDENT_AMBULATORY_CARE_PROVIDER_SITE_OTHER): Payer: BLUE CROSS/BLUE SHIELD | Admitting: Internal Medicine

## 2014-12-30 ENCOUNTER — Encounter: Payer: Self-pay | Admitting: Internal Medicine

## 2014-12-30 VITALS — BP 110/68 | HR 91 | Ht 64.0 in | Wt 156.0 lb

## 2014-12-30 DIAGNOSIS — J309 Allergic rhinitis, unspecified: Secondary | ICD-10-CM | POA: Diagnosis not present

## 2014-12-30 DIAGNOSIS — H1013 Acute atopic conjunctivitis, bilateral: Secondary | ICD-10-CM | POA: Diagnosis not present

## 2014-12-30 DIAGNOSIS — J320 Chronic maxillary sinusitis: Secondary | ICD-10-CM

## 2014-12-30 DIAGNOSIS — J3089 Other allergic rhinitis: Principal | ICD-10-CM

## 2014-12-30 DIAGNOSIS — J302 Other seasonal allergic rhinitis: Secondary | ICD-10-CM

## 2014-12-30 MED ORDER — AZELASTINE HCL 0.1 % NA SOLN: NASAL | Status: DC

## 2014-12-30 MED ORDER — OLOPATADINE HCL 0.7 % OP SOLN: 1.0000 [drp] | Freq: Every day | OPHTHALMIC | Status: DC

## 2014-12-30 NOTE — Progress Notes (Signed)
06/21/11- 39 year old female never smoker followed for asthma, allergic rhinitis, urticaria Last here-09/02/2009. Has not had recent hives. She continues taking Creon for a nonspecific GI, not CF. She says she had an episode of malaise which finally turned out to be associated with use of a weed killing spray outside of her window at work. She is pleased with allergy vaccine and convinced that helps. 1:50 is sufficient. She does take a daily antihistamine mainly out of habit and we discussed this. Always some fullness behind the eyes and the left medial eyebrow. Using an occasional Benadryl or Sudafed. She remembers using a nasal steroid spray over a year ago and concluded it was little benefit. Notes a little wheezing only with colds.  12/21/11- 39 year old female never smoker followed for asthma, allergic rhinitis, urticaria Flonase helps. Occasionally needs decongestant. Has had some pressure medial aspect of left eye and nose. Continues allergy vaccine at 1:10. Using Neti pot. Has horses, donkey, llama.  08/18/12- 39 year old female never smoker followed for asthma, allergic rhinitis, urticaria follows for: pt states doing well on injections. pt states has a runny nose all the time. Has farm animals. Recent appendectomy without problem. Continues allergy vaccine 1:10 GO.  Denies chest problems. Uses Symbicort only with colds. Admits some dyspnea with exertion, without wheeze. Today she has right retro-orbital headache with no nasal discharge. Using Claritin and Sudafed.  02/16/13- 39 year old female never smoker followed for asthma, allergic rhinitis, urticaria FOLLOWS FOR: still on vaccine and no reactions, PND, tired, and runny nose. Slight wheezing last month Continues allergy vaccine 1:10 GO. Dymista was not cost effective. Still using leftover Advair and has not tried to Symbicort sample. Today notices watery nose and sore throat  09/13/13- 39 year old female never smoker followed for  asthma, allergic rhinitis, urticaria FOLLOWS FOR: Breathing is doing well. Cold weather can aggravate it. Reports SOB, chest tightness and coughing when she goes in to cold air. Continues allergy vaccine 1:10 GO. No recent urticaria. Left eye watering x2 weeks without burn or itch.  12/30/14- 39 year old female never smoker followed for asthma, allergic rhinitis, urticaria Follows for: doing well; runny nose; allergy shots helping; itchy eyes Allergy vaccine 1:10 GO Watery and sometimes bloody nose. Using Flonase and Neti pot. Headache diagnosed as migraine and much better controlled now. Dental x-ray showed left maxillary sinus "compacted"-which I assume means she may have a chronic left maxillary sinusitis. She avoids antibiotics if possible to avoid becoming allergic nasal congestion bothers her most when she has a cold. Sometimes it bothers her also at work where there may be a moldy environment-unsure.  ROS-See HPI Constitutional:   No-   weight loss, night sweats, fevers, chills, fatigue, lassitude. HEENT:   No-  headaches, difficulty swallowing, tooth/dental problems, sore throat,       No-sneezing, itching, ear ache, +nasal congestion, +post nasal drip,  CV:  No-   chest pain, orthopnea, PND, swelling in lower extremities, anasarca, dizziness, palpitations Resp: No-   shortness of breath with exertion or at rest.              No-   productive cough,  No non-productive cough,  No- coughing up of blood.              No-   change in color of mucus.  No- wheezing.   Skin: No-   rash or lesions. GI:  No-   heartburn, indigestion, abdominal pain, nausea, vomiting,  GU: MS:  No-   joint pain or swelling.  Neuro-     nothing unusual Psych:  No- change in mood or affect. No depression or anxiety.  No memory loss.  OBJ General- Alert, Oriented, Affect-appropriate, Distress- none acute; overweight Skin- rash-none, lesions- none, excoriation- none Lymphadenopathy- none Head- atraumatic             Eyes- Gross vision intact, PERRLA, conjunctivae+ watery. Periorbital  edema            Ears- Hearing, canals-normal            Nose- + turbinate edema , no-Septal dev, mucus, polyps, erosion, perforation             Throat- Mallampati III , +mucosa red, drainage- none, tonsils  present Neck- flexible , trachea midline, no stridor , thyroid nl, carotid no bruit Chest - symmetrical excursion , unlabored           Heart/CV- RRR , no murmur , no gallop  , no rub, nl s1 s2                           - JVD- none , edema- none, stasis changes- none, varices- none           Lung- clear to P&A, wheeze- none, cough- none , dullness-none, rub- none           Chest wall-  Abd-  Br/ Gen/ Rectal- Not done, not indicated Extrem- cyanosis- none, clubbing, none, atrophy- none, strength- nl Neuro- grossly intact to observation

## 2014-12-30 NOTE — Patient Instructions (Signed)
Script and coupon for [facility] eye drops   1 drop each eye daily during allergy season  Script to try adding Astelin nasal spray while continuing flonase  Ok to continue allergy vaccine 1:10 GO

## 2015-01-04 DIAGNOSIS — H1010 Acute atopic conjunctivitis, unspecified eye: Secondary | ICD-10-CM | POA: Insufficient documentation

## 2015-01-04 DIAGNOSIS — J320 Chronic maxillary sinusitis: Secondary | ICD-10-CM | POA: Insufficient documentation

## 2015-01-04 NOTE — Assessment & Plan Note (Signed)
She continues allergy vaccine. We discussed possibility that Flonase was causing some epistaxis. Plan-try Astelin nasal spray while continuing Flonase

## 2015-01-04 NOTE — Assessment & Plan Note (Signed)
Plan-discussed role of systemic antihistamines. Try P[place]o eyedrops

## 2015-01-04 NOTE — Assessment & Plan Note (Signed)
Discussed roles of saline rinse and decongestant

## 2015-02-18 ENCOUNTER — Other Ambulatory Visit: Payer: Self-pay | Admitting: Internal Medicine

## 2015-02-18 ENCOUNTER — Other Ambulatory Visit: Payer: Self-pay | Admitting: Obstetrics and Gynecology

## 2015-02-18 NOTE — Telephone Encounter (Signed)
Medication refill request: Nuvaring  Last AEX:  02-15-14 Next AEX: 07-03-15 Last MMG (if hormonal medication request): age 39  Refill authorized: please advise

## 2015-02-21 ENCOUNTER — Ambulatory Visit: Payer: Self-pay | Admitting: Obstetrics and Gynecology

## 2015-04-24 ENCOUNTER — Ambulatory Visit (INDEPENDENT_AMBULATORY_CARE_PROVIDER_SITE_OTHER): Payer: BLUE CROSS/BLUE SHIELD | Admitting: Obstetrics and Gynecology

## 2015-04-24 ENCOUNTER — Encounter: Payer: Self-pay | Admitting: Obstetrics and Gynecology

## 2015-04-24 ENCOUNTER — Emergency Department (HOSPITAL_COMMUNITY)
Admission: EM | Admit: 2015-04-24 | Discharge: 2015-04-25 | Disposition: A | Discharge status: Home / Self Care | Payer: BLUE CROSS/BLUE SHIELD | Attending: Emergency Medicine | Admitting: Emergency Medicine

## 2015-04-24 VITALS — BP 110/80 | HR 80 | Resp 18 | Ht 64.0 in | Wt 147.8 lb

## 2015-04-24 DIAGNOSIS — Z79899 Other long term (current) drug therapy: Secondary | ICD-10-CM | POA: Diagnosis not present

## 2015-04-24 DIAGNOSIS — Z01419 Encounter for gynecological examination (general) (routine) without abnormal findings: Secondary | ICD-10-CM | POA: Diagnosis not present

## 2015-04-24 DIAGNOSIS — Z0001 Encounter for general adult medical examination with abnormal findings: Secondary | ICD-10-CM | POA: Diagnosis not present

## 2015-04-24 DIAGNOSIS — Z0000 Encounter for general adult medical examination without abnormal findings: Secondary | ICD-10-CM

## 2015-04-24 DIAGNOSIS — Z8619 Personal history of other infectious and parasitic diseases: Secondary | ICD-10-CM | POA: Diagnosis not present

## 2015-04-24 DIAGNOSIS — F329 Major depressive disorder, single episode, unspecified: Secondary | ICD-10-CM | POA: Insufficient documentation

## 2015-04-24 DIAGNOSIS — R202 Paresthesia of skin: Secondary | ICD-10-CM

## 2015-04-24 DIAGNOSIS — N760 Acute vaginitis: Secondary | ICD-10-CM | POA: Diagnosis not present

## 2015-04-24 DIAGNOSIS — R109 Unspecified abdominal pain: Secondary | ICD-10-CM | POA: Diagnosis not present

## 2015-04-24 DIAGNOSIS — R799 Abnormal finding of blood chemistry, unspecified: Secondary | ICD-10-CM | POA: Diagnosis present

## 2015-04-24 DIAGNOSIS — K219 Gastro-esophageal reflux disease without esophagitis: Secondary | ICD-10-CM | POA: Diagnosis not present

## 2015-04-24 DIAGNOSIS — Z87828 Personal history of other (healed) physical injury and trauma: Secondary | ICD-10-CM | POA: Insufficient documentation

## 2015-04-24 DIAGNOSIS — R6889 Other general symptoms and signs: Secondary | ICD-10-CM | POA: Diagnosis not present

## 2015-04-24 DIAGNOSIS — Z7951 Long term (current) use of inhaled steroids: Secondary | ICD-10-CM | POA: Diagnosis not present

## 2015-04-24 DIAGNOSIS — Z91040 Latex allergy status: Secondary | ICD-10-CM | POA: Insufficient documentation

## 2015-04-24 DIAGNOSIS — J45909 Unspecified asthma, uncomplicated: Secondary | ICD-10-CM | POA: Insufficient documentation

## 2015-04-24 LAB — CBC
HCT: 13.9 % — CL (ref 36.0–46.0)
Hemoglobin: 4.6 g/dL — CL (ref 12.0–15.0)
MCH: 31.5 pg (ref 26.0–34.0)
MCHC: 33.1 g/dL (ref 30.0–36.0)
MCV: 95.2 fL (ref 78.0–100.0)
MPV: 9.4 fL (ref 8.6–12.4)
Platelets: 547 10*3/uL — ABNORMAL HIGH (ref 150–400)
RBC: 1.46 MIL/uL — ABNORMAL LOW (ref 3.87–5.11)
RDW: 13.8 % (ref 11.5–15.5)
WBC: 7.2 10*3/uL (ref 4.0–10.5)

## 2015-04-24 LAB — COMPREHENSIVE METABOLIC PANEL
ALT: 19 U/L (ref 6–29)
AST: 25 U/L (ref 10–30)
Albumin: 4.4 g/dL (ref 3.6–5.1)
Alkaline Phosphatase: 105 U/L (ref 33–115)
BUN: 9 mg/dL (ref 7–25)
CO2: 21 mmol/L (ref 20–31)
Calcium: 9.4 mg/dL (ref 8.6–10.2)
Chloride: 105 mmol/L (ref 98–110)
Creat: 0.72 mg/dL (ref 0.50–1.10)
Glucose, Bld: 80 mg/dL (ref 65–99)
Potassium: 3.7 mmol/L (ref 3.5–5.3)
Sodium: 136 mmol/L (ref 135–146)
Total Bilirubin: 0.5 mg/dL (ref 0.2–1.2)
Total Protein: 7.1 g/dL (ref 6.1–8.1)

## 2015-04-24 LAB — LIPID PANEL
Cholesterol: 146 mg/dL (ref 125–200)
HDL: 61 mg/dL (ref >=46)
LDL Cholesterol: 68 mg/dL (ref <130)
Total CHOL/HDL Ratio: 2.4 Ratio (ref <=5.0)
Triglycerides: 84 mg/dL (ref <150)
VLDL: 17 mg/dL (ref <30)

## 2015-04-24 LAB — POCT URINALYSIS DIPSTICK
Bilirubin, UA: NEGATIVE
Blood, UA: NEGATIVE
Glucose, UA: NEGATIVE
Ketones, UA: NEGATIVE
Leukocytes, UA: NEGATIVE
Nitrite, UA: NEGATIVE
Protein, UA: NEGATIVE
Urobilinogen, UA: NEGATIVE
pH, UA: 5

## 2015-04-24 LAB — THYROID PANEL WITH TSH
Free Thyroxine Index: 2.8 (ref 1.4–3.8)
T3 Uptake: 23 % (ref 22–35)
T4, Total: 12.3 ug/dL — ABNORMAL HIGH (ref 4.5–12.0)
TSH: 1.323 u[IU]/mL (ref 0.350–4.500)

## 2015-04-24 LAB — VITAMIN B12: Vitamin B-12: 482 pg/mL (ref 211–911)

## 2015-04-24 MED ORDER — ETONOGESTREL-ETHINYL ESTRADIOL 0.12-0.015 MG/24HR VA RING: 1.0000 | VAGINAL_RING | VAGINAL | Status: DC

## 2015-04-24 MED ORDER — GABAPENTIN POWD: Status: DC

## 2015-04-24 NOTE — Patient Instructions (Signed)

## 2015-04-24 NOTE — Progress Notes (Signed)
Patient ID: Monica Hansen, female   DOB: [birth year], 39 y.o.   MRN: [id number] 39 y.o. Monica Hansen Married Caucasian female here for annual exam.    Recovered from back injury.  Life is back to normal now.   Has cold intolerance and tingling in extremities. Wants full thyroid function testing and vit B12 checked in addition to routine labs.   Happy with Monica Hansen.   Still having pain with intercourse.  Cannot use tampons.  Has external and internal pain.  Dyspareunia. Tried lubricants and oils and these do not help.  They can cause pain and irritation. Toilet paper can hurt and cause bleeding.  Can also have a lot of itching.  (just took Monica Hansen and symptoms improved). 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 Had a straddle injury from falling on a diving board.  Able to ride horses.   Has had negative HSV testing in the past.   Mother diagnosed with breast cancer - Ductal CA.  Had surgery and did radiation.  No genetic testing done. Believes that females in the family also had uterine cancer.  Maternal grandfather died from colon cancer.   PCP:  Monica Hansen  Patient's last menstrual period was 04/16/2015 (exact date).          Sexually active: Yes.   female The current method of family planning is Monica Hansen vaginal inserts.    Exercising: Yes.    walking. Smoker:  no  Health Maintenance: Pap:  02-02-13 Neg:Neg HR HPV History of abnormal Pap:  no MMG:  n/a Colonoscopy:  1997 and dx'd with fissures. BMD:   n/a  Result  n/a TDaP:  2007 Screening Labs:  Urine today: Neg   reports that she has never smoked. She does not have any smokeless tobacco history on file. She reports that she does not drink alcohol or use illicit drugs.  Past Medical History  Diagnosis Date  . Allergic rhinitis   . Asthma   . Depression   . GERD (gastroesophageal reflux disease)   . [facility] spotted fever   . Foot injury     right foot from a horse  . Broken back 02-10-14     Past Surgical History  Procedure Laterality Date  . Shoulder fusion  2002  . Laparoscopic appendectomy  06/18/2012    Procedure: APPENDECTOMY LAPAROSCOPIC;  Surgeon: Monica Hollingshead, MD;  Location: [facility];  Service: General;  Laterality: N/A;  . Mole removal    . Perineal laceration repair  1980    Fell off diving board    Current Outpatient Prescriptions  Medication Sig Dispense Refill  . ADVAIR DISKUS 100-50 MCG/DOSE AEPB USE 1 PUFF EVERY 12 HOURS 60 each 3  . azelastine (ASTELIN) 0.1 % nasal spray 1-2 puffs each nostril once or twice daily as needed 30 mL 12  . buPROPion (WELLBUTRIN XL) 300 MG 24 hr tablet Take 300 mg by mouth daily.      . fexofenadine (ALLEGRA) 180 MG tablet Take 180 mg by mouth daily.    . fluticasone (FLONASE) 50 MCG/ACT nasal spray USE 1 TO 2 SPRAYS IN EACH NOSTRIL ONCE DAILY AT BEDTIME 16 g 4  . Monica Hansen 0.12-0.015 MG/24HR vaginal ring PLACE 1 RING VAGINALLY FOR 3 WEEKS THEN REMOVE FOR 1 WEEK 1 each 3  . Olopatadine HCl (PAZEO) 0.7 % SOLN Apply 1 drop to eye daily. 1 Bottle prn  . pantoprazole (PROTONIX) 40 MG tablet Take 1 tablet by mouth daily.  4  .  PROAIR HFA 108 (90 BASE) MCG/ACT inhaler INHALE 2 PUFFS INTO THE LUNGS EVERY 4 (FOUR) HOURS AS NEEDED FOR SHORTNESS OF BREATH. 8.5 each 3  . SUMAtriptan (IMITREX) 50 MG tablet Take 50 mg by mouth every 2 (two) hours as needed for migraine or headache. May repeat in 2 hours if headache persists or recurs.    . topiramate (TOPAMAX) 50 MG tablet Take 50 mg by mouth at bedtime.    Marland Kitchen UNABLE TO FIND Allergy shots    . zolpidem (AMBIEN) 10 MG tablet Take 1 tablet by mouth at bedtime.  1   No current facility-administered medications for this visit.    Family History  Problem Relation Age of Onset  . Gallbladder disease Father   . Colon cancer Maternal Grandfather   . Leukemia Paternal Grandfather   . Alzheimer's disease Paternal Grandfather   . Mental illness Mother   . Hyperlipidemia Mother   . Breast  cancer Mother 33    ROS:  Pertinent items are noted in HPI.  Otherwise, a comprehensive ROS was negative.  Exam:   BP 110/80 mmHg  Pulse 80  Resp 18  Ht 5\' 4"  (1.626 m)  Wt 147 lb 12.8 oz (67.042 kg)  BMI 25.36 kg/m2  LMP 04/16/2015 (Exact Date)    General appearance: alert, cooperative and appears stated age Head: Normocephalic, without obvious abnormality, atraumatic Neck: no adenopathy, supple, symmetrical, trachea midline and thyroid normal to inspection and palpation Lungs: clear to auscultation bilaterally Breasts: normal appearance, no masses or tenderness, Inspection negative, No nipple retraction or dimpling, No nipple discharge or bleeding, No axillary or supraclavicular adenopathy Heart: regular rate and rhythm Abdomen: soft, non-tender; bowel sounds normal; no masses,  no organomegaly Extremities: extremities normal, atraumatic, no cyanosis or edema Skin: Skin color, texture, turgor normal. No rashes or lesions Lymph nodes: Cervical, supraclavicular, and axillary nodes normal. No abnormal inguinal nodes palpated Neurologic: Grossly normal  Pelvic: External genitalia:  no lesions.  Discomfort to palpation to the perineum with Q tip.              Urethra:  normal appearing urethra with no masses, tenderness or lesions              Bartholins and Skenes: normal                 Vagina: normal appearing vagina with normal color and discharge, no lesions.  Monica Hansen in.               Cervix: no lesions              Pap taken: No. Bimanual Exam:  Uterus:  normal size, contour, position, consistency, mobility, non-tender              Adnexa: normal adnexa and no mass, fullness, tenderness              Rectovaginal: No..     Chaperone was present for exam.  Assessment:   Well woman visit with normal exam. Cold intolerance.  Extremity tingling.  Painful vulva.  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 FH breast cancer and colon cancer.  Possible FH of other gyn cancer.   Plan: Yearly  mammogram recommended after age 74.  Discussed genetic testing.  Declined at this time.  Recommended self breast exam.  Pap and HR HPV as above. Discussed Calcium, Vitamin D, regular exercise program including cardiovascular and weight bearing exercise. Labs performed.  Yes.  .   See orders.  Affirm also sent.  Refills given on medications.  Yes.  .  See orders.  Refill Monica Hansen for one year.  Will also do compounded Gabepentin 6% in versa base to vulva q hs and td prn.   Discussed side effects - dizziness, muscle weakness.  Return for a recheck in 1 month.  Follow up annually and prn.      After visit summary provided.

## 2015-04-25 ENCOUNTER — Telehealth: Payer: Self-pay | Admitting: Obstetrics and Gynecology

## 2015-04-25 ENCOUNTER — Encounter (HOSPITAL_COMMUNITY): Payer: Self-pay | Admitting: Emergency Medicine

## 2015-04-25 LAB — TYPE AND SCREEN
ABO/RH(D): A POS
Antibody Screen: NEGATIVE

## 2015-04-25 LAB — CBC WITH DIFFERENTIAL/PLATELET
Basophils Absolute: 0 10*3/uL (ref 0.0–0.1)
Basophils Relative: 0 % (ref 0–1)
Eosinophils Absolute: 0.3 10*3/uL (ref 0.0–0.7)
Eosinophils Relative: 3 % (ref 0–5)
HCT: 38.2 % (ref 36.0–46.0)
Hemoglobin: 12.6 g/dL (ref 12.0–15.0)
Lymphocytes Relative: 36 % (ref 12–46)
Lymphs Abs: 3.7 10*3/uL (ref 0.7–4.0)
MCH: 29.2 pg (ref 26.0–34.0)
MCHC: 33 g/dL (ref 30.0–36.0)
MCV: 88.4 fL (ref 78.0–100.0)
Monocytes Absolute: 0.9 10*3/uL (ref 0.1–1.0)
Monocytes Relative: 9 % (ref 3–12)
Neutro Abs: 5.3 10*3/uL (ref 1.7–7.7)
Neutrophils Relative %: 52 % (ref 43–77)
Platelets: 287 10*3/uL (ref 150–400)
RBC: 4.32 MIL/uL (ref 3.87–5.11)
RDW: 12.7 % (ref 11.5–15.5)
WBC: 10.1 10*3/uL (ref 4.0–10.5)

## 2015-04-25 LAB — WET PREP BY MOLECULAR PROBE
Candida species: NEGATIVE
Gardnerella vaginalis: NEGATIVE
Trichomonas vaginosis: NEGATIVE

## 2015-04-25 LAB — ABO/RH: ABO/RH(D): A POS

## 2015-04-25 NOTE — Discharge Instructions (Signed)
Anemia, Nonspecific Monica Hansen, your hemoglobin today is 12.6, which is within normal range.  Your lab blood work was likely a lab error.  See your regular doctor within 3 days for close follow up. If symptoms worsen, come back to the ED immediately.  Thank you. Anemia is a condition in which the concentration of red blood cells or hemoglobin in the blood is below normal. Hemoglobin is a substance in red blood cells that carries oxygen to the tissues of the body. Anemia results in not enough oxygen reaching these tissues.  CAUSES  Common causes of anemia include:   Excessive bleeding. Bleeding may be internal or external. This includes excessive bleeding from periods (in women) or from the intestine.   Poor nutrition.   Chronic kidney, thyroid, and liver disease.  Bone marrow disorders that decrease red blood cell production.  Cancer and treatments for cancer.  HIV, AIDS, and their treatments.  Spleen problems that increase red blood cell destruction.  Blood disorders.  Excess destruction of red blood cells due to infection, medicines, and autoimmune disorders. SIGNS AND SYMPTOMS   Minor weakness.   Dizziness.   Headache.  Palpitations.   Shortness of breath, especially with exercise.   Paleness.  Cold sensitivity.  Indigestion.  Nausea.  Difficulty sleeping.  Difficulty concentrating. Symptoms may occur suddenly or they may develop slowly.  DIAGNOSIS  Additional blood tests are often needed. These help your health care provider determine the best treatment. Your health care provider will check your stool for blood and look for other causes of blood loss.  TREATMENT  Treatment varies depending on the cause of the anemia. Treatment can include:   Supplements of iron, vitamin G29, or folic acid.   Hormone medicines.   A blood transfusion. This may be needed if blood loss is severe.   Hospitalization. This may be needed if there is significant continual  blood loss.   Dietary changes.  Spleen removal. HOME CARE INSTRUCTIONS Keep all follow-up appointments. It often takes many weeks to correct anemia, and having your health care provider check on your condition and your response to treatment is very important. SEEK IMMEDIATE MEDICAL CARE IF:   You develop extreme weakness, shortness of breath, or chest pain.   You become dizzy or have trouble concentrating.  You develop heavy vaginal bleeding.   You develop a rash.   You have bloody or black, tarry stools.   You faint.   You vomit up blood.   You vomit repeatedly.   You have abdominal pain.  You have a fever or persistent symptoms for more than 2-3 days.   You have a fever and your symptoms suddenly get worse.   You are dehydrated.  MAKE SURE YOU:  Understand these instructions.  Will watch your condition.  Will get help right away if you are not doing well or get worse. Document Released: 09/30/2004 Document Revised: 04/25/2013 Document Reviewed: 02/16/2013 [facility] Patient Information 2015 [place], [place]. This information is not intended to replace advice given to you by your health care provider. Make sure you discuss any questions you have with your health care provider.

## 2015-04-25 NOTE — Telephone Encounter (Signed)
Patient calling to check in with the nurse. She said, "Dr. Quincy Simmonds sent me over to the ER last night. I went and am just calling to give an update to the nurse."

## 2015-04-25 NOTE — ED Notes (Signed)
Pt arrived to the ED sent by her PCP for an abnormal CBC.  Pt hemoglobin, hematocrit, RBC and Platelets were out of range.  Pt states she is asymptomatic.  Pt has a history of asthma.

## 2015-04-25 NOTE — Telephone Encounter (Signed)
Spoke with patient. Patient states she is calling to give Dr.Silva on an update on how she is doing today after being seen at the ER last night. Patient states that she feels very well. Denies fatigue, shortness of breath, weakness, etc. States her CBC was rechecked at the ER and everything was normal. Was advised to have follow up with Dr.Silva in 3 days. Patient has questions regarding lab work and ER visit. Advised would have Dr.Silva review and return call with further recommendations. Patient is agreeable.  Cc: Lamont Snowball, RN

## 2015-04-25 NOTE — Telephone Encounter (Signed)
Left message to call Kaitlyn at [phone]. 

## 2015-04-25 NOTE — Telephone Encounter (Signed)
Return call to patient. Addressed concerns regarding lab results and advised we would follow-up with her beginning of next week. Repeat CBC scheduled for Monday 04-28-15 at 815 am.

## 2015-04-25 NOTE — Telephone Encounter (Signed)
Returning call.

## 2015-04-25 NOTE — Telephone Encounter (Signed)
Return call to patient, left message to call back. 

## 2015-04-25 NOTE — Telephone Encounter (Signed)
Return call to Kaitlyn. °

## 2015-04-25 NOTE — Telephone Encounter (Signed)
Thank you for the update.  I am aware of the repeat CBC at the emergency department.  Recheck of CBC next week.

## 2015-04-25 NOTE — ED Provider Notes (Signed)
CSN: [id number]     Arrival date & time 04/24/15  2351 History  This chart was scribed for Everlene Balls, MD by Eustaquio Maize, ED Scribe. This patient was seen in room WA05/WA05 and the patient's care was started at 12:21 AM.  Chief Complaint  Patient presents with  . Abnormal Labs    Abnormal Labs   The history is provided by the patient. No language interpreter was used.     HPI Comments: Monica Hansen is a 39 y.o. female who presents to the Emergency Department for abnormal CBC results. Pt states she received a call from [facility] stating that Hemoglobin, Hematocrit, RBC, and Platelet levels were out of range and that she needed to come to the ED immediately. Pt was seen by OBGYN 1 day ago for a routine visit. She notes that she has always been cold natured but notes that recently she has also felt cold while being outside in the sun. She also reports a mild tingling sensation to her extremities as well as blurred vision. Pt was seen by an opthalmologist recently who told her her vision was fine. Pt has hx of asthma but cannot say if she has been short of breath lately. Husband notes that pt could be short of breath but would not complain of it due to pt being able to function with low O2 saturation. Pt also notes mild abdominal pain that began tonight after eating chicken pot pie. She states it feels like indigestion. She notes a decrease in energy levels recently as well. Denies hematochezia, prolonged menstrual periods, melena, or any other abnormal bleeding. Pt also denies recent pain medication usage.    Past Medical History  Diagnosis Date  . Allergic rhinitis   . Asthma   . Depression   . GERD (gastroesophageal reflux disease)   . [facility] spotted fever   . Foot injury     right foot from a horse  . Broken back 02-10-14   Past Surgical History  Procedure Laterality Date  . Shoulder fusion  2002  . Laparoscopic appendectomy  06/18/2012    Procedure: APPENDECTOMY  LAPAROSCOPIC;  Surgeon: Odis Hollingshead, MD;  Location: [facility];  Service: General;  Laterality: N/A;  . Mole removal    . Perineal laceration repair  1980    Fell off diving board   Family History  Problem Relation Age of Onset  . Gallbladder disease Father   . Colon cancer Maternal Grandfather   . Leukemia Paternal Grandfather   . Alzheimer's disease Paternal Grandfather   . Mental illness Mother   . Hyperlipidemia Mother   . Breast cancer Mother 50   Social History  Substance Use Topics  . Smoking status: Never Smoker   . Smokeless tobacco: None  . Alcohol Use: No   OB History    Gravida Para Term Preterm AB TAB SAB Ectopic Multiple Living   0 0 0 0 0 0 0 0 0 0      Review of Systems  10 Systems reviewed and all are negative for acute change except as noted in the HPI.    Allergies  Erythromycin; Latex; Sulfonamide derivatives; Tetracyclines & related; Adhesive; and Iodinated diagnostic agents  Home Medications   Prior to Admission medications   Medication Sig Start Date End Date Taking? Authorizing Provider  ADVAIR DISKUS 100-50 MCG/DOSE AEPB USE 1 PUFF EVERY 12 HOURS 02/19/15  Yes Deneise Lever, MD  azelastine (ASTELIN) 0.1 % nasal spray 1-2 puffs each nostril  once or twice daily as needed Patient taking differently: Place 1 spray into both nostrils 2 (two) times daily. 1-2 puffs each nostril once or twice daily as needed 12/30/14  Yes Deneise Lever, MD  buPROPion (WELLBUTRIN XL) 300 MG 24 hr tablet Take 300 mg by mouth daily.     Yes Historical Provider, MD  etonogestrel-ethinyl estradiol (NUVARING) 0.12-0.015 MG/24HR vaginal ring Place 1 each vaginally every 28 (twenty-eight) days. Insert vaginally and leave in place for 3 consecutive weeks, then remove for 1 week. 04/24/15  Yes Brook E Yisroel Ramming, MD  fexofenadine (ALLEGRA) 180 MG tablet Take 180 mg by mouth daily.   Yes Historical Provider, MD  fluticasone (FLONASE) 50 MCG/ACT nasal spray USE 1 TO 2 SPRAYS  IN EACH NOSTRIL ONCE DAILY AT BEDTIME 05/06/14  Yes Deneise Lever, MD  Multiple Vitamin (MULTIVITAMIN WITH MINERALS) TABS tablet Take 1 tablet by mouth daily.   Yes Historical Provider, MD  pantoprazole (PROTONIX) 40 MG tablet Take 1 tablet by mouth daily. 04/05/15  Yes Historical Provider, MD  PROAIR HFA 108 (90 BASE) MCG/ACT inhaler INHALE 2 PUFFS INTO THE LUNGS EVERY 4 (FOUR) HOURS AS NEEDED FOR SHORTNESS OF BREATH. 07/23/13  Yes Deneise Lever, MD  SUMAtriptan (IMITREX) 50 MG tablet Take 50 mg by mouth every 2 (two) hours as needed for migraine or headache. May repeat in 2 hours if headache persists or recurs.   Yes Historical Provider, MD  topiramate (TOPAMAX) 50 MG tablet Take 50 mg by mouth at bedtime.   Yes Historical Provider, MD  UNABLE TO FIND Allergy shots   Yes Historical Provider, MD  zolpidem (AMBIEN) 10 MG tablet Take 1 tablet by mouth at bedtime. 04/05/15  Yes Historical Provider, MD  Gabapentin POWD Gabapentin 6% in versa base to vulva q hs and tid prn. 04/24/15   Nunzio Cobbs, MD  Olopatadine HCl (PAZEO) 0.7 % SOLN Apply 1 drop to eye daily. Patient not taking: Reported on 04/25/2015 12/30/14   Deneise Lever, MD   Triage Vitals: BP 128/87 mmHg  Pulse 86  Temp(Src) 98.2 F (36.8 C) (Oral)  Resp 18  Ht 5\' 4"  (1.626 m)  Wt 145 lb (65.772 kg)  BMI 24.88 kg/m2  SpO2 99%  LMP 04/16/2015 (Exact Date)   Physical Exam  Constitutional: She is oriented to person, place, and time. She appears well-developed and well-nourished. No distress.  HENT:  Head: Normocephalic and atraumatic.  Nose: Nose normal.  Mouth/Throat: Oropharynx is clear and moist. No oropharyngeal exudate.  Eyes: Conjunctivae and EOM are normal. Pupils are equal, round, and reactive to light. No scleral icterus.  Neck: Normal range of motion. Neck supple. No JVD present. No tracheal deviation present. No thyromegaly present.  Cardiovascular: Normal rate, regular rhythm and normal heart sounds.  Exam  reveals no gallop and no friction rub.   No murmur heard. Pulmonary/Chest: Effort normal and breath sounds normal. No respiratory distress. She has no wheezes. She exhibits no tenderness.  Abdominal: Soft. Bowel sounds are normal. She exhibits no distension and no mass. There is no tenderness. There is no rebound and no guarding.  Musculoskeletal: Normal range of motion. She exhibits no edema or tenderness.  Lymphadenopathy:    She has no cervical adenopathy.  Neurological: She is alert and oriented to person, place, and time. No cranial nerve deficit. She exhibits normal muscle tone.  Skin: Skin is warm and dry. No rash noted. No erythema. No pallor.  Nursing note  and vitals reviewed.   ED Course  Procedures (including critical care time)  DIAGNOSTIC STUDIES: Oxygen Saturation is 99% on RA, normal by my interpretation.    COORDINATION OF CARE: 12:28 AM-Discussed treatment plan which includes CBC and Type and screen with pt at bedside and pt agreed to plan.   Labs Review Labs Reviewed  CBC WITH DIFFERENTIAL/PLATELET  TYPE AND SCREEN  ABO/RH    Imaging Review No results found. I have personally reviewed and evaluated these images and lab results as part of my medical decision-making.   EKG Interpretation None      MDM   Final diagnoses:  None   Patient presents emergency department for abnormal lab values. Her [facility] for evaluation. Her hemoglobin was 4, platelets were also very well. Psych lab error as the patient is not having any new or worsening symptoms. She denies bleeding from anywhere. Her hemoglobin 1 year ago was 13. Will repeat CBC here in the emergency department as well as type and screen.   Hemoglobin here is 12.6. Patient was reassured and encouraged to follow-up with her doctor.  She appears well and in NAD.  Her VS remain within her normal limits and she is safe for DC.  I personally performed the services described in this documentation, which  was scribed in my presence. The recorded information has been reviewed and is accurate.     Everlene Balls, MD 04/25/15 [id number]

## 2015-04-27 ENCOUNTER — Other Ambulatory Visit: Payer: Self-pay | Admitting: Obstetrics and Gynecology

## 2015-04-27 DIAGNOSIS — R899 Unspecified abnormal finding in specimens from other organs, systems and tissues: Secondary | ICD-10-CM

## 2015-04-28 ENCOUNTER — Other Ambulatory Visit: Payer: Self-pay | Admitting: *Deleted

## 2015-04-28 ENCOUNTER — Telehealth: Payer: Self-pay | Admitting: *Deleted

## 2015-04-28 ENCOUNTER — Other Ambulatory Visit (INDEPENDENT_AMBULATORY_CARE_PROVIDER_SITE_OTHER): Payer: BLUE CROSS/BLUE SHIELD

## 2015-04-28 DIAGNOSIS — R899 Unspecified abnormal finding in specimens from other organs, systems and tissues: Secondary | ICD-10-CM

## 2015-04-28 LAB — CBC
HCT: 41.3 % (ref 36.0–46.0)
Hemoglobin: 14 g/dL (ref 12.0–15.0)
MCH: 30.2 pg (ref 26.0–34.0)
MCHC: 33.9 g/dL (ref 30.0–36.0)
MCV: 89.2 fL (ref 78.0–100.0)
MPV: 9.5 fL (ref 8.6–12.4)
Platelets: 320 10*3/uL (ref 150–400)
RBC: 4.63 MIL/uL (ref 3.87–5.11)
RDW: 13.3 % (ref 11.5–15.5)
WBC: 8.3 10*3/uL (ref 4.0–10.5)

## 2015-04-28 NOTE — Telephone Encounter (Signed)
Patient was here for lab appointment this am. Fingerstick HGB done by lab tech was 13.5. Patient is aware.  Routing to provider for final review. Will close encounter.

## 2015-04-28 NOTE — Telephone Encounter (Signed)
Call to patient. Notified CBC results from today WNL and consistent with report from [facility] ED. No further follow-up needed. Note sent to patient through My Chart from Dr Quincy Simmonds but patient states she has forgotten code. (explained to patient how to change code so that she may see results.) As previously indicated to patient, see previous call, advised her we would follow-up today. Advised that [employer] will follow-up with her directly regarding lab result from 04-24-15.     Routing to provider for final review. Will close encounter.

## 2015-04-30 ENCOUNTER — Other Ambulatory Visit: Payer: Self-pay | Admitting: Internal Medicine

## 2015-04-30 DIAGNOSIS — R1084 Generalized abdominal pain: Secondary | ICD-10-CM

## 2015-05-02 ENCOUNTER — Ambulatory Visit
Admission: RE | Admit: 2015-05-02 | Discharge: 2015-05-02 | Disposition: A | Discharge status: Home / Self Care | Payer: BLUE CROSS/BLUE SHIELD | Source: Ambulatory Visit | Attending: Internal Medicine | Admitting: Internal Medicine

## 2015-05-02 DIAGNOSIS — R1084 Generalized abdominal pain: Secondary | ICD-10-CM

## 2015-05-08 DIAGNOSIS — K819 Cholecystitis, unspecified: Secondary | ICD-10-CM

## 2015-05-08 HISTORY — DX: Cholecystitis, unspecified: K81.9

## 2015-05-13 ENCOUNTER — Ambulatory Visit: Payer: Self-pay | Admitting: Surgery

## 2015-05-13 NOTE — H&P (Signed)
History of Present Illness Monica Hansen. Monica Motz MD; 05/13/2015 9:13 PM) Patient words: GB.  The patient is a 39 year old female who presents for evaluation of gall stones. This is a 39 yo female who presents with about three months of worsening RUQ abdominal pain. These episodes tend to occur after eating any meal containing fat. These episodes are associated with nausea, soft bowel movements, abdominal bloating and belching. They tend to last about 2-3 hours. She seems to always have some type of low-grade discomfort in this area. Symptoms not improved with antacids.   Labs showed normal LFT's   CLINICAL DATA: Abdominal pain.  EXAM: ULTRASOUND ABDOMEN COMPLETE  COMPARISON: None.  FINDINGS: Gallbladder: Gallstones. Gallbladder wall thickness 1.8 mm. Negative Murphy sign.  Common bile duct: Diameter: 5 mm.  Liver: No focal lesion identified. Within normal limits in parenchymal echogenicity.  IVC: No abnormality visualized.  Pancreas: Visualized portion unremarkable.  Spleen: Size and appearance within normal limits.  Right Kidney: Length: 10.2 cm. Echogenicity within normal limits. No mass or hydronephrosis visualized.  Left Kidney: Length: 10.8 cm. Echogenicity within normal limits. No mass or hydronephrosis visualized.  Abdominal aorta: No aneurysm visualized.  Other findings: None.  IMPRESSION: Gallstones. Exam is otherwise unremarkable.   Electronically Signed By: Marcello Moores Register On: 05/02/2015 11:09 Other Problems ([name], LPN; 0/10/7739 2:87 PM) Depression Gastroesophageal Reflux Disease Migraine Headache  Past Surgical History ([name], LPN; 04/12/7671 0:94 PM) Appendectomy Shoulder Surgery Left.  Diagnostic Studies History ([name], LPN; 7/0/9628 3:66 PM) Colonoscopy never Mammogram never  Allergies ([name], LPN; 10/16/4763 4:65 PM) Tetracycline *CHEMICALS* Erythromycin *DERMATOLOGICALS* Sulfa  Antibiotics  Medication History ([name], LPN; 0/11/5463 6:81 PM) BuPROPion HCl ER (XL) (300MG  Tablet ER 24HR, Oral) Active. Pantoprazole Sodium (40MG  Tablet DR, Oral) Active. Fluconazole (150MG  Tablet, Oral) Active. Fluticasone Propionate (50MCG/ACT Suspension, Nasal) Active. Azelastine HCl (0.1% Solution, Nasal) Active. Topamax (25MG  Tablet, Oral) Active. NuvaRing (0.12-0.015MG /24HR Ring, Vaginal) Active. Medications Reconciled  Social History ([name], LPN; 10/13/5168 0:17 PM) Alcohol use Occasional alcohol use. Caffeine use Carbonated beverages. No drug use Tobacco use Never smoker.  Family History ([name], LPN; 12/14/4494 7:59 PM) Breast Cancer Mother. Colon Polyps Mother. Depression Mother.  Pregnancy / Birth History [name], LPN; 09/11/3844 6:59 PM) Age at menarche 36 years. Gravida 0 Para 0 Regular periods     Review of Systems ([name] LPN; 05/09/5700 7:79 PM) General Present- Chills, Fatigue and Weight Loss. Not Present- Appetite Loss, Fever, Night Sweats and Weight Gain. Skin Not Present- Change in Wart/Mole, Dryness, Hives, Jaundice, New Lesions, Non-Healing Wounds, Rash and Ulcer. HEENT Not Present- Earache, Hearing Loss, Hoarseness, Nose Bleed, Oral Ulcers, Ringing in the Ears, Seasonal Allergies, Sinus Pain, Sore Throat, Visual Disturbances, Wears glasses/contact lenses and Yellow Eyes. Respiratory Not Present- Bloody sputum, Chronic Cough, Difficulty Breathing, Snoring and Wheezing. Breast Not Present- Breast Mass, Breast Pain, Nipple Discharge and Skin Changes. Gastrointestinal Present- Abdominal Pain, Bloating, Change in Bowel Habits, Indigestion and Nausea. Not Present- Bloody Stool, Chronic diarrhea, Constipation, Difficulty Swallowing, Excessive gas, Gets full quickly at meals, Hemorrhoids, Rectal Pain and Vomiting. Female Genitourinary Not Present- Frequency, Nocturia, Painful Urination, Pelvic Pain and  Urgency. Musculoskeletal Present- Back Pain. Not Present- Joint Pain, Joint Stiffness, Muscle Pain, Muscle Weakness and Swelling of Extremities. Neurological Present- Headaches and Tingling. Not Present- Decreased Memory, Fainting, Numbness, Seizures, Tremor, Trouble walking and Weakness. Psychiatric Present- Depression. Not Present- Anxiety, Bipolar, Change in Sleep Pattern, Fearful and Frequent crying. Endocrine Present- Cold Intolerance. Not Present- Excessive  Hunger, Hair Changes, Heat Intolerance, Hot flashes and New Diabetes.  Vitals ([name] LPN; 11/06/8248 5:39 PM) 05/13/2015 4:12 PM Weight: 146 lb Height: 64in Body Surface Area: 1.73 m Body Mass Index: 25.06 kg/m Temp.: 51F(Oral)  Pulse: 72 (Regular)  BP: 118/70 (Sitting, Left Arm, Standard)     Physical Exam [name] MD; 05/13/2015 9:13 PM)  The physical exam findings are as follows: Note:WDWN in NAD HEENT: EOMI, sclera anicteric Neck: No masses, no thyromegaly Lungs: CTA bilaterally; normal respiratory effort CV: Regular rate and rhythm; no murmurs Abd: +bowel sounds, soft, mild RUQ tenderness, no masses Ext: Well-perfused; no edema Skin: Warm, dry; no sign of jaundice    Assessment & Plan [name] MD; 05/13/2015 4:55 PM)  CHRONIC CHOLECYSTITIS WITH CALCULUS (K80.10)  Current Plans Schedule for Surgery - Laparoscopic cholecystectomy with intraoperative cholangiogram. The surgical procedure has been discussed with the patient. Potential risks, benefits, alternative treatments, and expected outcomes have been explained. All of the patient's questions at this time have been answered. The likelihood of reaching the patient's treatment goal is good. The patient understand the proposed surgical procedure and wishes to proceed. Pt Education - Pamphlet Given - Laparoscopic Gallbladder Surgery: discussed with patient and provided information.  Monica Hansen. [name], MD, [facility]  Surgery  General/ Trauma Surgery  05/13/2015 9:14 PM

## 2015-05-19 ENCOUNTER — Encounter (HOSPITAL_BASED_OUTPATIENT_CLINIC_OR_DEPARTMENT_OTHER): Payer: Self-pay | Admitting: *Deleted

## 2015-05-21 ENCOUNTER — Ambulatory Visit (HOSPITAL_BASED_OUTPATIENT_CLINIC_OR_DEPARTMENT_OTHER): Payer: BLUE CROSS/BLUE SHIELD | Admitting: Anesthesiology

## 2015-05-21 ENCOUNTER — Ambulatory Visit (HOSPITAL_BASED_OUTPATIENT_CLINIC_OR_DEPARTMENT_OTHER)
Admission: RE | Admit: 2015-05-21 | Discharge: 2015-05-21 | Disposition: A | Discharge status: Home / Self Care | Payer: BLUE CROSS/BLUE SHIELD | Source: Ambulatory Visit | Attending: Surgery | Admitting: Surgery

## 2015-05-21 ENCOUNTER — Ambulatory Visit (HOSPITAL_COMMUNITY): Payer: BLUE CROSS/BLUE SHIELD

## 2015-05-21 ENCOUNTER — Encounter (HOSPITAL_BASED_OUTPATIENT_CLINIC_OR_DEPARTMENT_OTHER)
Admission: RE | Disposition: A | Discharge status: Home / Self Care | Payer: Self-pay | Source: Ambulatory Visit | Attending: Surgery

## 2015-05-21 ENCOUNTER — Encounter (HOSPITAL_BASED_OUTPATIENT_CLINIC_OR_DEPARTMENT_OTHER): Payer: Self-pay | Admitting: *Deleted

## 2015-05-21 ENCOUNTER — Encounter: Payer: Self-pay | Admitting: Internal Medicine

## 2015-05-21 DIAGNOSIS — K219 Gastro-esophageal reflux disease without esophagitis: Secondary | ICD-10-CM | POA: Insufficient documentation

## 2015-05-21 DIAGNOSIS — K8010 Calculus of gallbladder with chronic cholecystitis without obstruction: Secondary | ICD-10-CM | POA: Insufficient documentation

## 2015-05-21 DIAGNOSIS — Z79899 Other long term (current) drug therapy: Secondary | ICD-10-CM | POA: Insufficient documentation

## 2015-05-21 DIAGNOSIS — G43909 Migraine, unspecified, not intractable, without status migrainosus: Secondary | ICD-10-CM | POA: Diagnosis not present

## 2015-05-21 DIAGNOSIS — F329 Major depressive disorder, single episode, unspecified: Secondary | ICD-10-CM | POA: Insufficient documentation

## 2015-05-21 DIAGNOSIS — K829 Disease of gallbladder, unspecified: Secondary | ICD-10-CM | POA: Diagnosis present

## 2015-05-21 DIAGNOSIS — Z419 Encounter for procedure for purposes other than remedying health state, unspecified: Secondary | ICD-10-CM

## 2015-05-21 HISTORY — DX: Adverse effect of unspecified anesthetic, initial encounter: T41.45XA

## 2015-05-21 HISTORY — DX: Personal history of (healed) traumatic fracture: Z87.81

## 2015-05-21 HISTORY — DX: Other allergy status, other than to drugs and biological substances: Z91.09

## 2015-05-21 HISTORY — DX: Family history of other specified conditions: Z84.89

## 2015-05-21 HISTORY — DX: Cholecystitis, unspecified: K81.9

## 2015-05-21 HISTORY — DX: Unspecified asthma, uncomplicated: J45.909

## 2015-05-21 HISTORY — DX: Other complications of anesthesia, initial encounter: T88.59XA

## 2015-05-21 HISTORY — DX: Fibromyalgia: M79.7

## 2015-05-21 HISTORY — PX: CHOLECYSTECTOMY: SHX55

## 2015-05-21 HISTORY — DX: Migraine, unspecified, not intractable, without status migrainosus: G43.909

## 2015-05-21 SURGERY — LAPAROSCOPIC CHOLECYSTECTOMY WITH INTRAOPERATIVE CHOLANGIOGRAM
Anesthesia: General | Site: Abdomen

## 2015-05-21 MED ORDER — LIDOCAINE HCL (CARDIAC) 20 MG/ML IV SOLN
INTRAVENOUS | Status: AC
  Filled 2015-05-21: qty 5

## 2015-05-21 MED ORDER — PROPOFOL 10 MG/ML IV BOLUS
INTRAVENOUS | Status: DC | PRN
  Administered 2015-05-21: 150 mg via INTRAVENOUS

## 2015-05-21 MED ORDER — MIDAZOLAM HCL 2 MG/2ML IJ SOLN
INTRAMUSCULAR | Status: AC
  Filled 2015-05-21: qty 4

## 2015-05-21 MED ORDER — ONDANSETRON HCL 4 MG/2ML IJ SOLN
INTRAMUSCULAR | Status: DC | PRN
  Administered 2015-05-21: 4 mg via INTRAVENOUS

## 2015-05-21 MED ORDER — GLYCOPYRROLATE 0.2 MG/ML IJ SOLN
INTRAMUSCULAR | Status: AC
  Filled 2015-05-21: qty 1

## 2015-05-21 MED ORDER — ROCURONIUM BROMIDE 100 MG/10ML IV SOLN
INTRAVENOUS | Status: DC | PRN
  Administered 2015-05-21: 40 mg via INTRAVENOUS

## 2015-05-21 MED ORDER — DIPHENHYDRAMINE HCL 50 MG/ML IJ SOLN: 12.5000 mg | Freq: Once | INTRAMUSCULAR | Status: DC | PRN

## 2015-05-21 MED ORDER — HYDROMORPHONE HCL 1 MG/ML IJ SOLN
INTRAMUSCULAR | Status: AC
  Filled 2015-05-21: qty 1

## 2015-05-21 MED ORDER — DEXAMETHASONE SODIUM PHOSPHATE 10 MG/ML IJ SOLN
INTRAMUSCULAR | Status: AC
  Filled 2015-05-21: qty 1

## 2015-05-21 MED ORDER — CHLORHEXIDINE GLUCONATE 4 % EX LIQD: 1.0000 "application " | Freq: Once | CUTANEOUS | Status: DC

## 2015-05-21 MED ORDER — FENTANYL CITRATE (PF) 100 MCG/2ML IJ SOLN
INTRAMUSCULAR | Status: AC
  Filled 2015-05-21: qty 4

## 2015-05-21 MED ORDER — ROCURONIUM BROMIDE 50 MG/5ML IV SOLN
INTRAVENOUS | Status: AC
  Filled 2015-05-21: qty 1

## 2015-05-21 MED ORDER — KETOROLAC TROMETHAMINE 30 MG/ML IJ SOLN: 30.0000 mg | Freq: Once | INTRAMUSCULAR | Status: DC

## 2015-05-21 MED ORDER — OXYCODONE-ACETAMINOPHEN 5-325 MG PO TABS: 1.0000 | ORAL_TABLET | ORAL | Status: DC | PRN

## 2015-05-21 MED ORDER — SCOPOLAMINE 1 MG/3DAYS TD PT72: 1.0000 | MEDICATED_PATCH | Freq: Once | TRANSDERMAL | Status: DC | PRN

## 2015-05-21 MED ORDER — CEFAZOLIN SODIUM-DEXTROSE 2-3 GM-% IV SOLR
2.0000 g | INTRAVENOUS | Status: AC
  Administered 2015-05-21: 2 g via INTRAVENOUS

## 2015-05-21 MED ORDER — PROMETHAZINE HCL 25 MG/ML IJ SOLN: 6.2500 mg | INTRAMUSCULAR | Status: DC | PRN

## 2015-05-21 MED ORDER — MIDAZOLAM HCL 2 MG/2ML IJ SOLN
1.0000 mg | INTRAMUSCULAR | Status: DC | PRN
  Administered 2015-05-21: 2 mg via INTRAVENOUS

## 2015-05-21 MED ORDER — METOCLOPRAMIDE HCL 5 MG/ML IJ SOLN
INTRAMUSCULAR | Status: DC | PRN
  Administered 2015-05-21: 10 mg via INTRAVENOUS

## 2015-05-21 MED ORDER — ONDANSETRON HCL 4 MG/2ML IJ SOLN: 4.0000 mg | INTRAMUSCULAR | Status: DC | PRN

## 2015-05-21 MED ORDER — IOHEXOL 300 MG/ML  SOLN
INTRAMUSCULAR | Status: DC | PRN
  Administered 2015-05-21: 10 mL

## 2015-05-21 MED ORDER — DIPHENHYDRAMINE HCL 50 MG/ML IJ SOLN
INTRAMUSCULAR | Status: AC
  Filled 2015-05-21: qty 1

## 2015-05-21 MED ORDER — FENTANYL CITRATE (PF) 100 MCG/2ML IJ SOLN
50.0000 ug | INTRAMUSCULAR | Status: DC | PRN
  Administered 2015-05-21 (×2): 50 ug via INTRAVENOUS

## 2015-05-21 MED ORDER — DEXAMETHASONE SODIUM PHOSPHATE 4 MG/ML IJ SOLN
INTRAMUSCULAR | Status: DC | PRN
  Administered 2015-05-21: 10 mg via INTRAVENOUS

## 2015-05-21 MED ORDER — CEFAZOLIN SODIUM-DEXTROSE 2-3 GM-% IV SOLR
INTRAVENOUS | Status: AC
  Filled 2015-05-21: qty 50

## 2015-05-21 MED ORDER — NEOSTIGMINE METHYLSULFATE 10 MG/10ML IV SOLN
INTRAVENOUS | Status: DC | PRN
  Administered 2015-05-21: 3 mg via INTRAVENOUS

## 2015-05-21 MED ORDER — LIDOCAINE HCL (CARDIAC) 10 MG/ML IV SOLN
INTRAVENOUS | Status: DC | PRN
  Administered 2015-05-21: 60 mg via INTRAVENOUS

## 2015-05-21 MED ORDER — ARTIFICIAL TEARS OP OINT
TOPICAL_OINTMENT | OPHTHALMIC | Status: AC
  Filled 2015-05-21: qty 3.5

## 2015-05-21 MED ORDER — HYDROMORPHONE HCL 1 MG/ML IJ SOLN
0.2500 mg | INTRAMUSCULAR | Status: DC | PRN
  Administered 2015-05-21 (×2): 0.5 mg via INTRAVENOUS

## 2015-05-21 MED ORDER — BUPIVACAINE-EPINEPHRINE (PF) 0.25% -1:200000 IJ SOLN
INTRAMUSCULAR | Status: AC
  Filled 2015-05-21: qty 30

## 2015-05-21 MED ORDER — NEOSTIGMINE METHYLSULFATE 10 MG/10ML IV SOLN
INTRAVENOUS | Status: AC
  Filled 2015-05-21: qty 1

## 2015-05-21 MED ORDER — DIPHENHYDRAMINE HCL 50 MG/ML IJ SOLN
12.5000 mg | Freq: Once | INTRAMUSCULAR | Status: AC
  Administered 2015-05-21: 12.5 mg via INTRAVENOUS

## 2015-05-21 MED ORDER — SODIUM CHLORIDE 0.9 % IR SOLN
Status: DC | PRN
  Administered 2015-05-21: 1

## 2015-05-21 MED ORDER — ONDANSETRON HCL 4 MG/2ML IJ SOLN
INTRAMUSCULAR | Status: AC
  Filled 2015-05-21: qty 2

## 2015-05-21 MED ORDER — GLYCOPYRROLATE 0.2 MG/ML IJ SOLN
0.2000 mg | Freq: Once | INTRAMUSCULAR | Status: AC | PRN
  Administered 2015-05-21: 0.4 mg via INTRAVENOUS

## 2015-05-21 MED ORDER — METOCLOPRAMIDE HCL 5 MG/ML IJ SOLN
INTRAMUSCULAR | Status: AC
  Filled 2015-05-21: qty 2

## 2015-05-21 MED ORDER — BUPIVACAINE-EPINEPHRINE 0.25% -1:200000 IJ SOLN
INTRAMUSCULAR | Status: DC | PRN
  Administered 2015-05-21: 14 mL

## 2015-05-21 MED ORDER — MORPHINE SULFATE (PF) 2 MG/ML IV SOLN: 2.0000 mg | INTRAVENOUS | Status: DC | PRN

## 2015-05-21 MED ORDER — LACTATED RINGERS IV SOLN
INTRAVENOUS | Status: DC
  Administered 2015-05-21 (×2): via INTRAVENOUS

## 2015-05-21 SURGICAL SUPPLY — 46 items
APL SKNCLS STERI-STRIP NONHPOA (GAUZE/BANDAGES/DRESSINGS) ×1
APPLIER CLIP ROT 10 11.4 M/L (STAPLE) ×2
APR CLP MED LRG 11.4X10 (STAPLE) ×1
BAG SPEC RTRVL LRG 6X4 10 (ENDOMECHANICALS) ×1
BENZOIN TINCTURE PRP APPL 2/3 (GAUZE/BANDAGES/DRESSINGS) ×2 IMPLANT
BLADE CLIPPER SURG (BLADE) IMPLANT
CHLORAPREP W/TINT 26ML (MISCELLANEOUS) ×2 IMPLANT
CLIP APPLIE ROT 10 11.4 M/L (STAPLE) ×1 IMPLANT
COVER MAYO STAND STRL (DRAPES) ×2 IMPLANT
COVER SURGICAL LIGHT HANDLE (MISCELLANEOUS) ×2 IMPLANT
DECANTER SPIKE VIAL GLASS SM (MISCELLANEOUS) ×2 IMPLANT
DRAPE C-ARM 42X72 X-RAY (DRAPES) ×2 IMPLANT
DRAPE LAPAROSCOPIC ABDOMINAL (DRAPES) ×2 IMPLANT
DRSG TEGADERM 2-3/8X2-3/4 SM (GAUZE/BANDAGES/DRESSINGS) ×6 IMPLANT
DRSG TEGADERM 4X4.75 (GAUZE/BANDAGES/DRESSINGS) ×2 IMPLANT
ELECT REM PT RETURN 9FT ADLT (ELECTROSURGICAL) ×2
ELECTRODE REM PT RTRN 9FT ADLT (ELECTROSURGICAL) ×1 IMPLANT
FILTER SMOKE EVAC LAPAROSHD (FILTER) IMPLANT
GLOVE BIO SURGEON STRL SZ7 (GLOVE) ×2 IMPLANT
GLOVE BIOGEL PI IND STRL 7.0 (GLOVE) IMPLANT
GLOVE BIOGEL PI IND STRL 7.5 (GLOVE) ×1 IMPLANT
GLOVE BIOGEL PI INDICATOR 7.0 (GLOVE) ×2
GLOVE BIOGEL PI INDICATOR 7.5 (GLOVE) ×2
GLOVE SURG SS PI 6.5 STRL IVOR (GLOVE) ×1 IMPLANT
GLOVE SURG SS PI 7.0 STRL IVOR (GLOVE) ×1 IMPLANT
HEMOSTAT SNOW SURGICEL 2X4 (HEMOSTASIS) IMPLANT
NS IRRIG 1000ML POUR BTL (IV SOLUTION) IMPLANT
PACK BASIN DAY SURGERY FS (CUSTOM PROCEDURE TRAY) ×2 IMPLANT
PAD ARMBOARD 7.5X6 YLW CONV (MISCELLANEOUS) ×1 IMPLANT
POUCH SPECIMEN RETRIEVAL 10MM (ENDOMECHANICALS) ×2 IMPLANT
SCISSORS LAP 5X35 DISP (ENDOMECHANICALS) ×1 IMPLANT
SET CHOLANGIOGRAPH 5 50 .035 (SET/KITS/TRAYS/PACK) ×2 IMPLANT
SET IRRIG TUBING LAPAROSCOPIC (IRRIGATION / IRRIGATOR) ×2 IMPLANT
SLEEVE ENDOPATH XCEL 5M (ENDOMECHANICALS) ×2 IMPLANT
SLEEVE SCD COMPRESS KNEE MED (MISCELLANEOUS) ×2 IMPLANT
SPECIMEN JAR SMALL (MISCELLANEOUS) ×2 IMPLANT
SPONGE GAUZE 2X2 8PLY STRL LF (GAUZE/BANDAGES/DRESSINGS) ×2 IMPLANT
STRIP CLOSURE SKIN 1/2X4 (GAUZE/BANDAGES/DRESSINGS) ×1 IMPLANT
SUT MNCRL AB 4-0 PS2 18 (SUTURE) ×2 IMPLANT
SUT VICRYL 0 UR6 27IN ABS (SUTURE) IMPLANT
TOWEL OR 17X24 6PK STRL BLUE (TOWEL DISPOSABLE) ×2 IMPLANT
TRAY LAPAROSCOPIC (CUSTOM PROCEDURE TRAY) ×2 IMPLANT
TROCAR XCEL BLUNT TIP 100MML (ENDOMECHANICALS) ×2 IMPLANT
TROCAR XCEL NON-BLD 11X100MML (ENDOMECHANICALS) ×2 IMPLANT
TROCAR XCEL NON-BLD 5MMX100MML (ENDOMECHANICALS) ×2 IMPLANT
TUBING INSUFFLATION (TUBING) ×2 IMPLANT

## 2015-05-21 NOTE — Anesthesia Postprocedure Evaluation (Signed)
  Anesthesia Post-op Note  Patient: Monica Hansen  Procedure(s) Performed: Procedure(s) (LRB): LAPAROSCOPIC CHOLECYSTECTOMY WITH INTRAOPERATIVE CHOLANGIOGRAM (N/A)  Patient Location: PACU  Anesthesia Type: General  Level of Consciousness: awake and alert   Airway and Oxygen Therapy: Patient Spontanous Breathing  Post-op Pain: mild  Post-op Assessment: Post-op Vital signs reviewed, Patient's Cardiovascular Status Stable, Respiratory Function Stable, Patent Airway and No signs of Nausea or vomiting  Last Vitals:  Filed Vitals:   05/21/15 1115  BP: 127/85  Pulse: 71  Temp:   Resp: 21    Post-op Vital Signs: stable   Complications: No apparent anesthesia complications

## 2015-05-21 NOTE — H&P (View-Only) (Signed)
History of Present Illness Monica Hansen. Monica Symanski MD; 05/13/2015 9:13 PM) Patient words: GB.  The patient is a 39 year old female who presents for evaluation of gall stones. This is a 39 yo female who presents with about three months of worsening RUQ abdominal pain. These episodes tend to occur after eating any meal containing fat. These episodes are associated with nausea, soft bowel movements, abdominal bloating and belching. They tend to last about 2-3 hours. She seems to always have some type of low-grade discomfort in this area. Symptoms not improved with antacids.   Labs showed normal LFT's   CLINICAL DATA: Abdominal pain.  EXAM: ULTRASOUND ABDOMEN COMPLETE  COMPARISON: None.  FINDINGS: Gallbladder: Gallstones. Gallbladder wall thickness 1.8 mm. Negative Murphy sign.  Common bile duct: Diameter: 5 mm.  Liver: No focal lesion identified. Within normal limits in parenchymal echogenicity.  IVC: No abnormality visualized.  Pancreas: Visualized portion unremarkable.  Spleen: Size and appearance within normal limits.  Right Kidney: Length: 10.2 cm. Echogenicity within normal limits. No mass or hydronephrosis visualized.  Left Kidney: Length: 10.8 cm. Echogenicity within normal limits. No mass or hydronephrosis visualized.  Abdominal aorta: No aneurysm visualized.  Other findings: None.  IMPRESSION: Gallstones. Exam is otherwise unremarkable.   Electronically Signed By: Marcello Moores Register On: 05/02/2015 11:09 Other Problems (Monica Eversole, LPN; 09/11/1094 0:45 PM) Depression Gastroesophageal Reflux Disease Migraine Headache  Past Surgical History (Monica Eversole, LPN; 4/0/9811 9:14 PM) Appendectomy Shoulder Surgery Left.  Diagnostic Studies History (Monica Eversole, LPN; 03/14/2955 2:13 PM) Colonoscopy never Mammogram never  Allergies (Monica Eversole, LPN; 0/04/6577 4:69 PM) Tetracycline *CHEMICALS* Erythromycin *DERMATOLOGICALS* Sulfa  Antibiotics  Medication History (Monica Eversole, LPN; 02/06/9527 4:13 PM) BuPROPion HCl ER (XL) (300MG  Tablet ER 24HR, Oral) Active. Pantoprazole Sodium (40MG  Tablet DR, Oral) Active. Fluconazole (150MG  Tablet, Oral) Active. Fluticasone Propionate (50MCG/ACT Suspension, Nasal) Active. Azelastine HCl (0.1% Solution, Nasal) Active. Topamax (25MG  Tablet, Oral) Active. NuvaRing (0.12-0.015MG /24HR Ring, Vaginal) Active. Medications Reconciled  Social History (Monica Borer, LPN; 10/10/4008 2:72 PM) Alcohol use Occasional alcohol use. Caffeine use Carbonated beverages. No drug use Tobacco use Never smoker.  Family History (Monica Eversole, LPN; 01/07/6643 0:34 PM) Breast Cancer Mother. Colon Polyps Mother. Depression Mother.  Pregnancy / Birth History Monica Borer, LPN; 03/09/2594 6:38 PM) Age at menarche 42 years. Gravida 0 Para 0 Regular periods     Review of Systems (Monica Eversole LPN; 03/10/6432 2:95 PM) General Present- Chills, Fatigue and Weight Loss. Not Present- Appetite Loss, Fever, Night Sweats and Weight Gain. Skin Not Present- Change in Wart/Mole, Dryness, Hives, Jaundice, New Lesions, Non-Healing Wounds, Rash and Ulcer. HEENT Not Present- Earache, Hearing Loss, Hoarseness, Nose Bleed, Oral Ulcers, Ringing in the Ears, Seasonal Allergies, Sinus Pain, Sore Throat, Visual Disturbances, Wears glasses/contact lenses and Yellow Eyes. Respiratory Not Present- Bloody sputum, Chronic Cough, Difficulty Breathing, Snoring and Wheezing. Breast Not Present- Breast Mass, Breast Pain, Nipple Discharge and Skin Changes. Gastrointestinal Present- Abdominal Pain, Bloating, Change in Bowel Habits, Indigestion and Nausea. Not Present- Bloody Stool, Chronic diarrhea, Constipation, Difficulty Swallowing, Excessive gas, Gets full quickly at meals, Hemorrhoids, Rectal Pain and Vomiting. Female Genitourinary Not Present- Frequency, Nocturia, Painful Urination, Pelvic Pain and  Urgency. Musculoskeletal Present- Back Pain. Not Present- Joint Pain, Joint Stiffness, Muscle Pain, Muscle Weakness and Swelling of Extremities. Neurological Present- Headaches and Tingling. Not Present- Decreased Memory, Fainting, Numbness, Seizures, Tremor, Trouble walking and Weakness. Psychiatric Present- Depression. Not Present- Anxiety, Bipolar, Change in Sleep Pattern, Fearful and Frequent crying. Endocrine Present- Cold Intolerance. Not Present- Excessive  Hunger, Hair Changes, Heat Intolerance, Hot flashes and New Diabetes.  Vitals (Monica Eversole LPN; 11/09/6859 6:83 PM) 05/13/2015 4:12 PM Weight: 146 lb Height: 64in Body Surface Area: 1.73 m Body Mass Index: 25.06 kg/m Temp.: 3F(Oral)  Pulse: 72 (Regular)  BP: 118/70 (Sitting, Left Arm, Standard)     Physical Exam Monica Key K. Sameeha Rockefeller MD; 05/13/2015 9:13 PM)  The physical exam findings are as follows: Note:WDWN in NAD HEENT: EOMI, sclera anicteric Neck: No masses, no thyromegaly Lungs: CTA bilaterally; normal respiratory effort CV: Regular rate and rhythm; no murmurs Abd: +bowel sounds, soft, mild RUQ tenderness, no masses Ext: Well-perfused; no edema Skin: Warm, dry; no sign of jaundice    Assessment & Plan Monica Key K. Lecia Esperanza MD; 05/13/2015 4:55 PM)  CHRONIC CHOLECYSTITIS WITH CALCULUS (K80.10)  Current Plans Schedule for Surgery - Laparoscopic cholecystectomy with intraoperative cholangiogram. The surgical procedure has been discussed with the patient. Potential risks, benefits, alternative treatments, and expected outcomes have been explained. All of the patient's questions at this time have been answered. The likelihood of reaching the patient's treatment goal is good. The patient understand the proposed surgical procedure and wishes to proceed. Pt Education - Pamphlet Given - Laparoscopic Gallbladder Surgery: discussed with patient and provided information.  Monica Hansen. [name], MD, [facility]  Surgery  General/ Trauma Surgery  05/13/2015 9:14 PM

## 2015-05-21 NOTE — Discharge Instructions (Signed)
[facility] °LAPAROSCOPIC SURGERY: POST OP INSTRUCTIONS °Always review your discharge instruction sheet given to you by the facility where your surgery was performed. °IF YOU HAVE DISABILITY OR FAMILY LEAVE FORMS, YOU MUST BRING THEM TO THE OFFICE FOR PROCESSING.   °DO NOT GIVE THEM TO YOUR DOCTOR. ° °1. A prescription for pain medication will be given to you upon discharge.  Take your pain medication as prescribed, if needed.  If narcotic pain medicine is not needed, then you may take acetaminophen (Tylenol) or ibuprofen (Advil) as needed. °2. Take your usually prescribed medications unless otherwise directed. °3. If you need a refill on your pain medication, please contact your pharmacy.  They will contact our office to request authorization. Prescriptions will not be filled after 5pm or on week-ends. °4. You should follow a light diet the first few days after arrival home, such as soup and crackers, etc.  Be sure to include lots of fluids daily. °5. Most patients will experience some swelling and bruising in the area of the incisions.  Ice packs will help.  Swelling and bruising can take several days to resolve.  °6. It is common to experience some constipation if taking pain medication after surgery.  Increasing fluid intake and taking a stool softener (such as Colace) will usually help or prevent this problem from occurring.  A mild laxative (Milk of Magnesia or Miralax) should be taken according to package instructions if there are no bowel movements after 48 hours. °7. Unless discharge instructions indicate otherwise, you may remove your bandages 48 hours after surgery, and you may shower at that time.  You will have steri-strips (small skin tapes) in place directly over the incision.  These strips should be left on the skin for 7-10 days.  If your surgeon used skin glue on the incision, you may shower in 24 hours.  The glue will flake off over the next 2-3 weeks.  Any sutures or staples  will be removed at the office during your follow-up visit. °8. ACTIVITIES:  You may resume regular (light) daily activities beginning the next day--such as daily self-care, walking, climbing stairs--gradually increasing activities as tolerated.  You may have sexual intercourse when it is comfortable.  Refrain from any heavy lifting or straining until approved by your doctor. °a. You may drive when you are no longer taking prescription pain medication, you can comfortably wear a seatbelt, and you can safely maneuver your car and apply brakes. °b. RETURN TO WORK:   2-3 weeks °9. You should see your doctor in the office for a follow-up appointment approximately 2-3 weeks after your surgery.  Make sure that you call for this appointment within a day or two after you arrive home to insure a convenient appointment time. °10. OTHER INSTRUCTIONS: ________________________________________________________________________ °WHEN TO CALL YOUR DOCTOR: °1. Fever over 101.0 °2. Inability to urinate °3. Continued bleeding from incision. °4. Increased pain, redness, or drainage from the incision. °5. Increasing abdominal pain ° °The clinic staff is available to answer your questions during regular business hours.  Please don’t hesitate to call and ask to speak to one of the nurses for clinical concerns.  If you have a medical emergency, go to the nearest emergency room or call 911.  A surgeon from [facility] is always on call at the hospital. °[address], [address], [place], [place]  [address] ? [address] [address], [place], [place]   [address] °[phone] ? [phone] ? FAX [phone] °Web site:   [email or web address] ° °Post Anesthesia Home Care Instructions ° °Activity: °Get plenty of rest for the remainder of the day. A responsible adult should stay with you for 24 hours following the procedure.  °For the next 24 hours, DO NOT: °-Drive a car °-Operate machinery °-Drink alcoholic beverages °-Take any  medication unless instructed by your physician °-Make any legal decisions or sign important papers. ° °Meals: °Start with liquid foods such as gelatin or soup. Progress to regular foods as tolerated. Avoid greasy, spicy, heavy foods. If nausea and/or vomiting occur, drink only clear liquids until the nausea and/or vomiting subsides. Call your physician if vomiting continues. ° °Special Instructions/Symptoms: °Your throat may feel dry or sore from the anesthesia or the breathing tube placed in your throat during surgery. If this causes discomfort, gargle with warm salt water. The discomfort should disappear within 24 hours. ° °If you had a scopolamine patch placed behind your ear for the management of post- operative nausea and/or vomiting: ° °1. The medication in the patch is effective for 72 hours, after which it should be removed.  Wrap patch in a tissue and discard in the trash. Wash hands thoroughly with soap and water. °2. You may remove the patch earlier than 72 hours if you experience unpleasant side effects which may include dry mouth, dizziness or visual disturbances. °3. Avoid touching the patch. Wash your hands with soap and water after contact with the patch. °  ° °

## 2015-05-21 NOTE — Anesthesia Preprocedure Evaluation (Signed)
Anesthesia Evaluation  Patient identified by MRN, date of birth, ID band Patient awake    Reviewed: Allergy & Precautions, NPO status , Patient's Chart, lab work & pertinent test results  Airway Mallampati: II  TM Distance: >3 FB Neck ROM: Full    Dental no notable dental hx.    Pulmonary neg pulmonary ROS,    Pulmonary exam normal breath sounds clear to auscultation       Cardiovascular negative cardio ROS Normal cardiovascular exam Rhythm:Regular Rate:Normal     Neuro/Psych negative neurological ROS  negative psych ROS   GI/Hepatic Neg liver ROS, GERD  Medicated,  Endo/Other  negative endocrine ROS  Renal/GU negative Renal ROS  negative genitourinary   Musculoskeletal negative musculoskeletal ROS (+)   Abdominal   Peds negative pediatric ROS (+)  Hematology negative hematology ROS (+)   Anesthesia Other Findings   Reproductive/Obstetrics negative OB ROS                             Anesthesia Physical Anesthesia Plan  ASA: II  Anesthesia Plan: General   Post-op Pain Management:    Induction: Intravenous  Airway Management Planned: Oral ETT  Additional Equipment:   Intra-op Plan:   Post-operative Plan: Extubation in OR  Informed Consent: I have reviewed the patients History and Physical, chart, labs and discussed the procedure including the risks, benefits and alternatives for the proposed anesthesia with the patient or authorized representative who has indicated his/her understanding and acceptance.   Dental advisory given  Plan Discussed with: CRNA and Surgeon  Anesthesia Plan Comments:         Anesthesia Quick Evaluation  

## 2015-05-21 NOTE — Transfer of Care (Signed)
Immediate Anesthesia Transfer of Care Note  Patient: Monica Hansen  Procedure(s) Performed: Procedure(s): LAPAROSCOPIC CHOLECYSTECTOMY WITH INTRAOPERATIVE CHOLANGIOGRAM (N/A)  Patient Location: PACU  Anesthesia Type:General  Level of Consciousness: awake, sedated and patient cooperative  Airway & Oxygen Therapy: Patient Spontanous Breathing and Patient connected to face mask oxygen  Post-op Assessment: Report given to RN and Post -op Vital signs reviewed and stable  Post vital signs: Reviewed and stable  Last Vitals:  Filed Vitals:   05/21/15 0837  BP: 132/87  Pulse: 66  Temp: 36.6 C  Resp: 20    Complications: No apparent anesthesia complications

## 2015-05-21 NOTE — Op Note (Signed)
Laparoscopic Cholecystectomy with IOC Procedure Note  Indications: This patient presents with symptomatic gallbladder disease and will undergo laparoscopic cholecystectomy.  Pre-operative Diagnosis: Calculus of gallbladder with other cholecystitis, without mention of obstruction  Post-operative Diagnosis: Same  Surgeon: Lorianna Spadaccini K.   Assistants: none  Anesthesia: General endotracheal anesthesia  ASA Class: 1  Procedure Details  The patient was seen again in the Holding Room. The risks, benefits, complications, treatment options, and expected outcomes were discussed with the patient. The possibilities of reaction to medication, pulmonary aspiration, perforation of viscus, bleeding, recurrent infection, finding a normal gallbladder, the need for additional procedures, failure to diagnose a condition, the possible need to convert to an open procedure, and creating a complication requiring transfusion or operation were discussed with the patient. The likelihood of improving the patient's symptoms with return to their baseline status is good.  The patient and/or family concurred with the proposed plan, giving informed consent. The site of surgery properly noted. The patient was taken to Operating Room, identified as Monica Hansen and the procedure verified as Laparoscopic Cholecystectomy with Intraoperative Cholangiogram. A Time Out was held and the above information confirmed.  Prior to the induction of general anesthesia, antibiotic prophylaxis was administered. General endotracheal anesthesia was then administered and tolerated well. After the induction, the abdomen was prepped with Chloraprep and draped in the sterile fashion. The patient was positioned in the supine position.  Local anesthetic agent was injected into the skin near the umbilicus and an incision made. We dissected down to the abdominal fascia with blunt dissection.  The fascia was incised vertically and we entered the  peritoneal cavity bluntly.  A pursestring suture of 0-Vicryl was placed around the fascial opening.  The Hasson cannula was inserted and secured with the stay suture.  Pneumoperitoneum was then created with CO2 and tolerated well without any adverse changes in the patient's vital signs. An 11-mm port was placed in the subxiphoid position.  Two 5-mm ports were placed in the right upper quadrant. All skin incisions were infiltrated with a local anesthetic agent before making the incision and placing the trocars.   We positioned the patient in reverse Trendelenburg, tilted slightly to the patient's left.  The gallbladder was identified, the fundus grasped and retracted cephalad. Adhesions were lysed bluntly and with the electrocautery where indicated, taking care not to injure any adjacent organs or viscus. The infundibulum was grasped and retracted laterally, exposing the peritoneum overlying the triangle of Calot. This was then divided and exposed in a blunt fashion. A critical view of the cystic duct and cystic artery was obtained.  The cystic duct was clearly identified and bluntly dissected circumferentially. The cystic duct was ligated with a clip distally.   An incision was made in the cystic duct and the [facility] cholangiogram catheter introduced. The catheter was secured using a clip. A cholangiogram was then obtained which showed good visualization of the distal and proximal biliary tree with no sign of filling defects or obstruction.  Contrast flowed easily into the duodenum. The catheter was then removed.   The cystic duct was then ligated with clips and divided. The cystic artery was identified, dissected free, ligated with clips and divided as well.   The gallbladder was dissected from the liver bed in retrograde fashion with the electrocautery. The gallbladder was removed and placed in an Endocatch sac. The liver bed was irrigated and inspected. Hemostasis was achieved with the electrocautery. Copious  irrigation was utilized and was repeatedly aspirated until clear.  The gallbladder and Endocatch sac were then removed through the umbilical port site.  The pursestring suture was used to close the umbilical fascia.    We again inspected the right upper quadrant for hemostasis.  Pneumoperitoneum was released as we removed the trocars.  4-0 Monocryl was used to close the skin.   Benzoin, steri-strips, and clean dressings were applied. The patient was then extubated and brought to the recovery room in stable condition. Instrument, sponge, and needle counts were correct at closure and at the conclusion of the case.   Findings: Cholecystitis with Cholelithiasis  Estimated Blood Loss: Minimal         Drains: none         Specimens: Gallbladder           Complications: None; patient tolerated the procedure well.         Disposition: PACU - hemodynamically stable.         Condition: stable  Monica Hansen. Monica Dover, MD, [name]  General/ Trauma Surgery  05/21/2015 10:59 AM

## 2015-05-21 NOTE — Interval H&P Note (Signed)
History and Physical Interval Note:  05/21/2015 7:07 AM  Monica Hansen  has presented today for surgery, with the diagnosis of Cholecystitis  The various methods of treatment have been discussed with the patient and family. After consideration of risks, benefits and other options for treatment, the patient has consented to  Procedure(s): LAPAROSCOPIC CHOLECYSTECTOMY WITH INTRAOPERATIVE CHOLANGIOGRAM (N/A) as a surgical intervention .  The patient's history has been reviewed, patient examined, no change in status, stable for surgery.  I have reviewed the patient's chart and labs.  Questions were answered to the patient's satisfaction.     Yovani Cogburn K.

## 2015-05-22 ENCOUNTER — Telehealth: Payer: Self-pay | Admitting: Obstetrics and Gynecology

## 2015-05-22 NOTE — Telephone Encounter (Signed)
Thank you for the update.  Encounter closed. 

## 2015-05-22 NOTE — Telephone Encounter (Signed)
Patient called to reschedule her appointment for tomorrow had her gallbladder taken out yesterday and has not been cleared to drive. Rescheduled for 06/04/15 with Dr. Quincy Simmonds

## 2015-05-23 ENCOUNTER — Encounter (HOSPITAL_BASED_OUTPATIENT_CLINIC_OR_DEPARTMENT_OTHER): Payer: Self-pay | Admitting: Surgery

## 2015-05-23 ENCOUNTER — Ambulatory Visit: Payer: BLUE CROSS/BLUE SHIELD | Admitting: Obstetrics and Gynecology

## 2015-05-30 ENCOUNTER — Ambulatory Visit
Admission: RE | Admit: 2015-05-30 | Discharge: 2015-05-30 | Disposition: A | Discharge status: Home / Self Care | Payer: BLUE CROSS/BLUE SHIELD | Source: Ambulatory Visit | Attending: Internal Medicine | Admitting: Internal Medicine

## 2015-05-30 ENCOUNTER — Other Ambulatory Visit: Payer: Self-pay | Admitting: Internal Medicine

## 2015-05-30 DIAGNOSIS — R0789 Other chest pain: Secondary | ICD-10-CM

## 2015-06-01 ENCOUNTER — Other Ambulatory Visit: Payer: Self-pay | Admitting: Obstetrics & Gynecology

## 2015-06-01 ENCOUNTER — Other Ambulatory Visit: Payer: Self-pay | Admitting: Internal Medicine

## 2015-06-02 NOTE — Telephone Encounter (Signed)
Medication refill request: Nuvaring  Last AEX:  04/24/15 Dr. Quincy Simmonds Next AEX: 06/04/15 Dr. Quincy Simmonds Last MMG (if hormonal medication request): None Refill authorized: 04/24/15 #1each w/ 11R to [facility]

## 2015-06-04 ENCOUNTER — Other Ambulatory Visit: Payer: Self-pay | Admitting: Internal Medicine

## 2015-06-04 ENCOUNTER — Encounter: Payer: Self-pay | Admitting: Obstetrics and Gynecology

## 2015-06-04 ENCOUNTER — Ambulatory Visit (INDEPENDENT_AMBULATORY_CARE_PROVIDER_SITE_OTHER): Payer: BLUE CROSS/BLUE SHIELD | Admitting: Obstetrics and Gynecology

## 2015-06-04 VITALS — BP 108/74 | HR 82 | Resp 14 | Wt 147.0 lb

## 2015-06-04 DIAGNOSIS — N94819 Vulvodynia, unspecified: Secondary | ICD-10-CM

## 2015-06-04 DIAGNOSIS — R7989 Other specified abnormal findings of blood chemistry: Secondary | ICD-10-CM

## 2015-06-04 DIAGNOSIS — R946 Abnormal results of thyroid function studies: Secondary | ICD-10-CM

## 2015-06-04 MED ORDER — ESTRADIOL 0.1 MG/GM VA CREA: TOPICAL_CREAM | VAGINAL | Status: DC

## 2015-06-04 NOTE — Patient Instructions (Signed)
Please reduce your Gabapentin to every other day at bed time.   Use the Estrace cream to the vulva every night for 2 weeks and then twice weekly. Use a dime size amount.
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
## 2015-07-03 ENCOUNTER — Ambulatory Visit (INDEPENDENT_AMBULATORY_CARE_PROVIDER_SITE_OTHER): Payer: BLUE CROSS/BLUE SHIELD | Admitting: Internal Medicine

## 2015-07-03 ENCOUNTER — Encounter: Payer: Self-pay | Admitting: Internal Medicine

## 2015-07-03 VITALS — BP 118/72 | HR 75 | Ht 64.0 in | Wt 145.8 lb

## 2015-07-03 DIAGNOSIS — J302 Other seasonal allergic rhinitis: Secondary | ICD-10-CM

## 2015-07-03 DIAGNOSIS — J309 Allergic rhinitis, unspecified: Secondary | ICD-10-CM | POA: Diagnosis not present

## 2015-07-03 DIAGNOSIS — J45998 Other asthma: Secondary | ICD-10-CM

## 2015-07-03 DIAGNOSIS — J3089 Other allergic rhinitis: Secondary | ICD-10-CM

## 2015-07-03 DIAGNOSIS — R21 Rash and other nonspecific skin eruption: Secondary | ICD-10-CM

## 2015-07-03 MED ORDER — EPINEPHRINE 0.3 MG/0.3ML IJ SOAJ: 0.3000 mg | Freq: Once | INTRAMUSCULAR | Status: AC

## 2015-07-03 NOTE — Patient Instructions (Signed)
Suggest - restart you allergy vaccine at 0.1 ml/ vial/ week of the 1:10 concentration  Then build as tolerated by adding 0.1 ml more per week till you get to maintenance dose of 0.5 ml/ week  If you have a small reaction at the site of the shot, drop back to the last dose you did not react to.  Play it safe, and let us know if you have problems or questions  I will try to find descriptions of the hospital stay, to look for clues.

## 2015-07-03 NOTE — Progress Notes (Signed)
06/21/11- 39 year old female never smoker followed for asthma, allergic rhinitis, urticaria Last here-09/02/2009. Has not had recent hives. She continues taking Creon for a nonspecific GI, not CF. She says she had an episode of malaise which finally turned out to be associated with use of a weed killing spray outside of her window at work. She is pleased with allergy vaccine and convinced that helps. 1:50 is sufficient. She does take a daily antihistamine mainly out of habit and we discussed this. Always some fullness behind the eyes and the left medial eyebrow. Using an occasional Benadryl or Sudafed. She remembers using a nasal steroid spray over a year ago and concluded it was little benefit. Notes a little wheezing only with colds.  12/21/11- 39 year old female never smoker followed for asthma, allergic rhinitis, urticaria Flonase helps. Occasionally needs decongestant. Has had some pressure medial aspect of left eye and nose. Continues allergy vaccine at 1:10. Using Neti pot. Has horses, donkey, llama.  08/18/12- 39 year old female never smoker followed for asthma, allergic rhinitis, urticaria follows for: pt states doing well on injections. pt states has a runny nose all the time. Has farm animals. Recent appendectomy without problem. Continues allergy vaccine 1:10 GO.  Denies chest problems. Uses Symbicort only with colds. Admits some dyspnea with exertion, without wheeze. Today she has right retro-orbital headache with no nasal discharge. Using Claritin and Sudafed.  02/16/13- 39 year old female never smoker followed for asthma, allergic rhinitis, urticaria FOLLOWS FOR: still on vaccine and no reactions, PND, tired, and runny nose. Slight wheezing last month Continues allergy vaccine 1:10 GO. Dymista was not cost effective. Still using leftover Advair and has not tried to Symbicort sample. Today notices watery nose and sore throat  09/13/13- 39 year old female never smoker followed for  asthma, allergic rhinitis, urticaria FOLLOWS FOR: Breathing is doing well. Cold weather can aggravate it. Reports SOB, chest tightness and coughing when she goes in to cold air. Continues allergy vaccine 1:10 GO. No recent urticaria. Left eye watering x2 weeks without burn or itch.  12/30/14- 39 year old female never smoker followed for asthma, allergic rhinitis, urticaria Follows for: doing well; runny nose; allergy shots helping; itchy eyes Allergy vaccine 1:10 GO Watery and sometimes bloody nose. Using Flonase and Neti pot. Headache diagnosed as migraine and much better controlled now. Dental x-ray showed left maxillary sinus "compacted"-which I assume means she may have a chronic left maxillary sinusitis. She avoids antibiotics if possible to avoid becoming allergic nasal congestion bothers her most when she has a cold. Sometimes it bothers her also at work where there may be a moldy environment-unsure.  07/02/15- 39 year old female never smoker followed for asthma, allergic rhinitis, urticaria Allergy vaccine 1:10 GO FOLLOWS FOR: Pt stopped allergy vaccine about 2 months ago-small reaction and other health related issues. Would like to know how proceed to restart.(gives own). will [facility]. Dropped off allergy vaccine at the end of July while very sick with gallbladder troubles. Then had sustained a problem with pruritus and rash on trunk and abdomen after biliary contrast study, antibiotics and surgery. Lost weight Doing better on Allegra 180 mg. Mild scratchy throat. No more rash. She had had a similar reaction after appendectomy. Question is whether this is reaction to anesthetic, antibiotic, contrast dye or something else. Rash has faded with time. She doesn't think prednisone helped.  she thinks allergy vaccine was helping before hiatus. Has had flu shot  ROS-See HPI Constitutional:   No-   weight loss,  night sweats, fevers, chills, fatigue,  lassitude. HEENT:   No-  headaches, difficulty swallowing, tooth/dental problems, sore throat,       No-sneezing, itching, ear ache, +nasal congestion, +post nasal drip,  CV:  No-   chest pain, orthopnea, PND, swelling in lower extremities, anasarca, dizziness, palpitations Resp: No-   shortness of breath with exertion or at rest.              No-   productive cough,  No non-productive cough,  No- coughing up of blood.              No-   change in color of mucus.  No- wheezing.   Skin: + History of present illness GI:  No-   heartburn, indigestion, abdominal pain, nausea, vomiting,  GU: MS:  No-   joint pain or swelling.   Neuro-     nothing unusual Psych:  No- change in mood or affect. No depression or anxiety.  No memory loss.  OBJ General- Alert, Oriented, Affect-appropriate, Distress- none acute; overweight Skin- rash-none, lesions- none, excoriation- none Lymphadenopathy- none Head- atraumatic            Eyes- Gross vision intact, PERRLA, conjunctivae+ watery. Periorbital  edema            Ears- Hearing, canals-normal            Nose- + turbinate edema , no-Septal dev, mucus, polyps, erosion, perforation             Throat- Mallampati III , mucosa -clear, drainage- none, tonsils  present Neck- flexible , trachea midline, no stridor , thyroid nl, carotid no bruit Chest - symmetrical excursion , unlabored           Heart/CV- RRR , no murmur , no gallop  , no rub, nl s1 s2                           - JVD- none , edema- none, stasis changes- none, varices- none           Lung- clear to P&A, wheeze- none, cough- none , dullness-none, rub- none           Chest wall-  Abd-  Br/ Gen/ Rectal- Not done, not indicated Extrem- cyanosis- none, clubbing, none, atrophy- none, strength- nl Neuro- grossly intact to observation

## 2015-07-04 DIAGNOSIS — R21 Rash and other nonspecific skin eruption: Secondary | ICD-10-CM | POA: Insufficient documentation

## 2015-07-04 NOTE — Assessment & Plan Note (Signed)
Mild intermittent well-controlled

## 2015-07-04 NOTE — Assessment & Plan Note (Signed)
Okay to restart allergy vaccine 1:10 at 0.1 ML's/vial and build by 0.1 ML per week until she reaches maintenance 0.5 ML's per week as discussed. EpiPen refilled

## 2015-07-14 ENCOUNTER — Telehealth: Payer: Self-pay

## 2015-07-14 NOTE — Telephone Encounter (Signed)
Patient returning your call.

## 2015-07-14 NOTE — Telephone Encounter (Signed)
-----   Message from Nunzio Cobbs, MD sent at 07/12/2015  6:53 PM EDT ----- Regarding: please contact patient to return for thyroid function labs Please contact patient to have her return to retest her thyroid function. Her T4 was elevated in August.  This came up in my reminder box as an overdue lab.  Future orders have already been placed.  Thank you,   Josefa Half ----- Message -----    From: SYSTEM    Sent: 07/12/2015  12:04 AM      To: Nunzio Cobbs, MD

## 2015-07-14 NOTE — Telephone Encounter (Signed)
Spoke with patient. Advised of message as seen below from [place]. Patient is agreeable. Lab appointment scheduled for 11/15 at 8:30 am.  Routing to provider for final review. Patient agreeable to disposition. Will close encounter.

## 2015-07-14 NOTE — Telephone Encounter (Signed)
Left message to call Kaitlyn at [phone]. 

## 2015-07-21 ENCOUNTER — Telehealth: Payer: Self-pay | Admitting: Obstetrics and Gynecology

## 2015-07-21 NOTE — Telephone Encounter (Signed)
Thank you for the update!

## 2015-07-21 NOTE — Telephone Encounter (Signed)
Patient called and said, "I need to cancel me upcoming lab appointment for 07/30/2015 due to a recent death in the family. I will call back to reschedule." Appointment cancelled. Routing to Dr. Quincy Simmonds for [place].

## 2015-07-22 ENCOUNTER — Other Ambulatory Visit: Payer: BLUE CROSS/BLUE SHIELD

## 2015-08-27 ENCOUNTER — Ambulatory Visit: Payer: BLUE CROSS/BLUE SHIELD | Admitting: Obstetrics and Gynecology

## 2015-09-11 ENCOUNTER — Telehealth: Payer: Self-pay

## 2015-09-11 NOTE — Telephone Encounter (Signed)
-----   Message from Nunzio Cobbs, MD sent at 09/08/2015  2:31 PM EST ----- Regarding: Please contact patient to reschedule her lab visit. Please contact patient back regarding her need to schedule a lab visit for thyroid function studies.   She cancelled due to a death in her family, and she has not rescheduled yet.   Thanks.   [place]  ----- Message -----    From: SYSTEM    Sent: 09/07/2015  12:04 AM      To: Nunzio Cobbs, MD

## 2015-09-11 NOTE — Telephone Encounter (Signed)
Spoke with patient. Patient states that she was seen with her PCP and had this drawn. Is waiting for results. Will have results sent to Dr.Silva to be placed in her chart.   Routing to provider for final review. Patient agreeable to disposition. Will close encounter.

## 2015-11-07 ENCOUNTER — Ambulatory Visit (INDEPENDENT_AMBULATORY_CARE_PROVIDER_SITE_OTHER): Payer: BLUE CROSS/BLUE SHIELD | Admitting: Internal Medicine

## 2015-11-07 ENCOUNTER — Telehealth: Payer: Self-pay | Admitting: Internal Medicine

## 2015-11-07 ENCOUNTER — Encounter: Payer: Self-pay | Admitting: Internal Medicine

## 2015-11-07 VITALS — BP 104/76 | HR 82 | Ht 64.0 in | Wt 142.8 lb

## 2015-11-07 DIAGNOSIS — J302 Other seasonal allergic rhinitis: Secondary | ICD-10-CM

## 2015-11-07 DIAGNOSIS — H1013 Acute atopic conjunctivitis, bilateral: Secondary | ICD-10-CM

## 2015-11-07 DIAGNOSIS — J45998 Other asthma: Secondary | ICD-10-CM | POA: Diagnosis not present

## 2015-11-07 DIAGNOSIS — J309 Allergic rhinitis, unspecified: Secondary | ICD-10-CM | POA: Diagnosis not present

## 2015-11-07 DIAGNOSIS — J3089 Other allergic rhinitis: Principal | ICD-10-CM

## 2015-11-07 MED ORDER — ALBUTEROL SULFATE HFA 108 (90 BASE) MCG/ACT IN AERS: INHALATION_SPRAY | RESPIRATORY_TRACT | Status: DC

## 2015-11-07 MED ORDER — "SYRINGE/NEEDLE (DISP) 25G X 5/8"" 1 ML MISC": Status: DC

## 2015-11-07 NOTE — Telephone Encounter (Signed)
Pharm calling about this also and can be reached @ [phone] [facility] [place] [facility]).Hillery Hunter

## 2015-11-07 NOTE — Progress Notes (Signed)
06/21/11- 40 year old female never smoker followed for asthma, allergic rhinitis, urticaria Last here-09/02/2009. Has not had recent hives. She continues taking Creon for a nonspecific GI, not CF. She says she had an episode of malaise which finally turned out to be associated with use of a weed killing spray outside of her window at work. She is pleased with allergy vaccine and convinced that helps. 1:50 is sufficient. She does take a daily antihistamine mainly out of habit and we discussed this. Always some fullness behind the eyes and the left medial eyebrow. Using an occasional Benadryl or Sudafed. She remembers using a nasal steroid spray over a year ago and concluded it was little benefit. Notes a little wheezing only with colds.  12/21/11- 40 year old female never smoker followed for asthma, allergic rhinitis, urticaria Flonase helps. Occasionally needs decongestant. Has had some pressure medial aspect of left eye and nose. Continues allergy vaccine at 1:10. Using Neti pot. Has horses, donkey, llama.  08/18/12- 40 year old female never smoker followed for asthma, allergic rhinitis, urticaria follows for: pt states doing well on injections. pt states has a runny nose all the time. Has farm animals. Recent appendectomy without problem. Continues allergy vaccine 1:10 GO.  Denies chest problems. Uses Symbicort only with colds. Admits some dyspnea with exertion, without wheeze. Today she has right retro-orbital headache with no nasal discharge. Using Claritin and Sudafed.  02/16/13- 40 year old female never smoker followed for asthma, allergic rhinitis, urticaria FOLLOWS FOR: still on vaccine and no reactions, PND, tired, and runny nose. Slight wheezing last month Continues allergy vaccine 1:10 GO. Dymista was not cost effective. Still using leftover Advair and has not tried to Symbicort sample. Today notices watery nose and sore throat  09/13/13- 40 year old female never smoker followed for  asthma, allergic rhinitis, urticaria FOLLOWS FOR: Breathing is doing well. Cold weather can aggravate it. Reports SOB, chest tightness and coughing when she goes in to cold air. Continues allergy vaccine 1:10 GO. No recent urticaria. Left eye watering x2 weeks without burn or itch.  12/30/14- 40 year old female never smoker followed for asthma, allergic rhinitis, urticaria Follows for: doing well; runny nose; allergy shots helping; itchy eyes Allergy vaccine 1:10 GO Watery and sometimes bloody nose. Using Flonase and Neti pot. Headache diagnosed as migraine and much better controlled now. Dental x-ray showed left maxillary sinus "compacted"-which I assume means she may have a chronic left maxillary sinusitis. She avoids antibiotics if possible to avoid becoming allergic nasal congestion bothers her most when she has a cold. Sometimes it bothers her also at work where there may be a moldy environment-unsure.  07/02/15- 40 year old female never smoker followed for asthma, allergic rhinitis, urticaria Allergy vaccine 1:10 GO FOLLOWS FOR: Pt stopped allergy vaccine about 2 months ago-small reaction and other health related issues. Would like to know how proceed to restart.(gives own). will need Rx for [facility] sent [facility] well. Dropped off allergy vaccine at the end of July while very sick with gallbladder troubles. Then had sustained a problem with pruritus and rash on trunk and abdomen after biliary contrast study, antibiotics and surgery. Lost weight Doing better on Allegra 180 mg. Mild scratchy throat. No more rash. She had had a similar reaction after appendectomy. Question is whether this is reaction to anesthetic, antibiotic, contrast dye or something else. Rash has faded with time. She doesn't think prednisone helped.  she thinks allergy vaccine was helping before hiatus. Has had flu shot  11/07/2015-40 year old female never smoker followed for asthma, allergic rhinitis,  urticaria Allergy vaccine 1:10 GO FOLLOWS FOR: Pt reports good tolerance of allergy vaccines. No new complaints. Needs refills of Albuterol and Syringes. Pt thinks she needs a new serum mixed. Not wheezing and rarely needing rescue inhaler. No sleep disturbance. No significant rhinitis symptoms yet this spring. We discussed continuation through this year, supplemented by nasal steroid and antihistamines as needed.  ROS-See HPI Constitutional:   No-   weight loss, night sweats, fevers, chills, fatigue, lassitude. HEENT:   No-  headaches, difficulty swallowing, tooth/dental problems, sore throat,       No-sneezing, itching, ear ache, +nasal congestion, +post nasal drip,  CV:  No-   chest pain, orthopnea, PND, swelling in lower extremities, anasarca, dizziness, palpitations Resp: No-   shortness of breath with exertion or at rest.              No-   productive cough,  No non-productive cough,  No- coughing up of blood.              No-   change in color of mucus.  No- wheezing.   Skin: + History of present illness GI:  No-   heartburn, indigestion, abdominal pain, nausea, vomiting,  GU: MS:  No-   joint pain or swelling.   Neuro-     nothing unusual Psych:  No- change in mood or affect. No depression or anxiety.  No memory loss.  OBJ General- Alert, Oriented, Affect-appropriate, Distress- none acute;  Skin- rash-none, lesions- none, excoriation- none Lymphadenopathy- none Head- atraumatic            Eyes- Gross vision intact, PERRLA, conjunctivae-clear. Periorbital  edema            Ears- Hearing, canals-normal            Nose- + turbinate edema , no-Septal dev, mucus, polyps, erosion, perforation             Throat- Mallampati III , mucosa -clear, drainage- none, tonsils  present Neck- flexible , trachea midline, no stridor , thyroid nl, carotid no bruit Chest - symmetrical excursion , unlabored           Heart/CV- RRR , no murmur , no gallop  , no rub, nl s1 s2                            - JVD- none , edema- none, stasis changes- none, varices- none           Lung- clear to P&A, wheeze- none, cough- none , dullness-none, rub- none           Chest wall-  Abd-  Br/ Gen/ Rectal- Not done, not indicated Extrem- cyanosis- none, clubbing, none, atrophy- none, strength- nl Neuro- grossly intact to observation

## 2015-11-07 NOTE — Telephone Encounter (Signed)
Spoke with pt. Rx has been sent in for allergy needle/syringes. Will route message to TS to make her aware that serum needs to be mixed. She would like for her allergy serum to be mailed to her.

## 2015-11-07 NOTE — Telephone Encounter (Signed)
Spoke with pharmacist to clarify rx sent for allergy syringes. Forwarding message to Alroy Bailiff to make aware that allergy vaccine needs to be mixed.

## 2015-11-07 NOTE — Patient Instructions (Signed)
Stop by Allergy to arrange refill allergy vaccine and syringes  Script sent for albuterol rescue inhaler refill  Please call as needed

## 2015-11-08 NOTE — Assessment & Plan Note (Signed)
Mild intermittent uncomplicated well-controlled. Rescue inhaler available if needed.

## 2015-11-08 NOTE — Assessment & Plan Note (Signed)
Some occasional watering but not much itching or redness. OTC allergy eyedrops if needed.

## 2015-11-08 NOTE — Assessment & Plan Note (Signed)
We can continue through this year but anticipates stopping for observation

## 2015-11-10 DIAGNOSIS — J309 Allergic rhinitis, unspecified: Secondary | ICD-10-CM | POA: Diagnosis not present

## 2015-11-10 NOTE — Telephone Encounter (Signed)
Allergy Serum Extract Date Mixed: 11/10/15 Vial: 1 Strength: 1:10 Here/Mail/Pick Up: mail Mixed By: tbs Last OV: 11/07/15 Pending OV:n/a

## 2016-02-05 ENCOUNTER — Encounter: Payer: Self-pay | Admitting: Internal Medicine

## 2016-02-05 ENCOUNTER — Ambulatory Visit (INDEPENDENT_AMBULATORY_CARE_PROVIDER_SITE_OTHER): Payer: BLUE CROSS/BLUE SHIELD | Admitting: Internal Medicine

## 2016-02-05 VITALS — BP 114/78 | HR 82 | Ht 64.0 in | Wt 146.2 lb

## 2016-02-05 DIAGNOSIS — J309 Allergic rhinitis, unspecified: Secondary | ICD-10-CM

## 2016-02-05 DIAGNOSIS — R21 Rash and other nonspecific skin eruption: Secondary | ICD-10-CM | POA: Diagnosis not present

## 2016-02-05 DIAGNOSIS — J302 Other seasonal allergic rhinitis: Secondary | ICD-10-CM

## 2016-02-05 DIAGNOSIS — J3089 Other allergic rhinitis: Secondary | ICD-10-CM

## 2016-02-05 NOTE — Patient Instructions (Addendum)
Script for Pepcid   Take 1 daily before a meal. Try this for now instead of Protonix until you haven't seen hives for a week or more  Please call as needed  We have stopped allergy shots

## 2016-02-05 NOTE — Progress Notes (Signed)
09/13/13- 40 year old female never smoker followed for asthma, allergic rhinitis, urticaria FOLLOWS FOR: Breathing is doing well. Cold weather can aggravate it. Reports SOB, chest tightness and coughing when she goes in to cold air. Continues allergy vaccine 1:10 GO. No recent urticaria. Left eye watering x2 weeks without burn or itch.  12/30/14- 40 year old female never smoker followed for asthma, allergic rhinitis, urticaria Follows for: doing well; runny nose; allergy shots helping; itchy eyes Allergy vaccine 1:10 GO Watery and sometimes bloody nose. Using Flonase and Neti pot. Headache diagnosed as migraine and much better controlled now. Dental x-ray showed left maxillary sinus "compacted"-which I assume means she may have a chronic left maxillary sinusitis. She avoids antibiotics if possible to avoid becoming allergic nasal congestion bothers her most when she has a cold. Sometimes it bothers her also at work where there may be a moldy environment-unsure.  07/02/15- 40 year old female never smoker followed for asthma, allergic rhinitis, urticaria Allergy vaccine 1:10 GO FOLLOWS FOR: Pt stopped allergy vaccine about 2 months ago-small reaction and other health related issues. Would like to know how proceed to restart.(gives own). will need [facility]. Dropped off allergy vaccine at the end of July while very sick with gallbladder troubles. Then had sustained a problem with pruritus and rash on trunk and abdomen after biliary contrast study, antibiotics and surgery. Lost weight Doing better on Allegra 180 mg. Mild scratchy throat. No more rash. She had had a similar reaction after appendectomy. Question is whether this is reaction to anesthetic, antibiotic, contrast dye or something else. Rash has faded with time. She doesn't think prednisone helped.  she thinks allergy vaccine was helping before hiatus. Has had flu shot  11/07/2015-40 year old female never  smoker followed for asthma, allergic rhinitis, urticaria Allergy vaccine 1:10 GO FOLLOWS FOR: Pt reports good tolerance of allergy vaccines. No new complaints. Needs refills of Albuterol and Syringes. Pt thinks she needs a new serum mixed. Not wheezing and rarely needing rescue inhaler. No sleep disturbance. No significant rhinitis symptoms yet this spring. We discussed continuation through this year, supplemented by nasal steroid and antihistamines as needed.  02/05/2016-40 year old female never smoker followed for asthma, allergic rhinitis, urticaria Allergy vaccine 1:10 GO- stopped by pt ACUTE VISIT: had severa outbreak of rash and hives-went to UC at [facility]-was given steroid shots and Epipen. Was told to followup here-unknown cause of reaction. Has not had allergy vaccine in a while Urticaria a long time, until she woke with diffuse hives 2 days before this visit. Had been outdoors all day the day before. Took one Benadryl that night for prevention. Remains on daily Allegra.  ROS-See HPI Constitutional:   No-   weight loss, night sweats, fevers, chills, fatigue, lassitude. HEENT:   No-  headaches, difficulty swallowing, tooth/dental problems, sore throat,       No-sneezing, itching, ear ache, +nasal congestion, +post nasal drip,  CV:  No-   chest pain, orthopnea, PND, swelling in lower extremities, anasarca, dizziness, palpitations Resp: No-   shortness of breath with exertion or at rest.              No-   productive cough,  No non-productive cough,  No- coughing up of blood.              No-   change in color of mucus.  No- wheezing.   Skin: + History of present illness GI:  No-   heartburn, indigestion, abdominal pain, nausea, vomiting,  GU: MS:  No-   joint pain or swelling.   Neuro-     nothing unusual Psych:  No- change in mood or affect. No depression or anxiety.  No memory loss.  OBJ General- Alert, Oriented, Affect-appropriate, Distress- none acute;  Skin- + minimal rash at  right ankle Lymphadenopathy- none Head- atraumatic            Eyes- Gross vision intact, PERRLA, conjunctivae-clear. Periorbital  edema            Ears- Hearing, canals-normal            Nose- + turbinate edema , no-Septal dev, mucus, polyps, erosion, perforation             Throat- Mallampati III , mucosa -clear, drainage- none, tonsils  present Neck- flexible , trachea midline, no stridor , thyroid nl, carotid no bruit Chest - symmetrical excursion , unlabored           Heart/CV- RRR , no murmur , no gallop  , no rub, nl s1 s2                           - JVD- none , edema- none, stasis changes- none, varices- none           Lung- clear to P&A, wheeze- none, cough- none , dullness-none, rub- none           Chest wall-  Abd-  Br/ Gen/ Rectal- Not done, not indicated Extrem- cyanosis- none, clubbing, none, atrophy- none, strength- nl Neuro- grossly intact to observation

## 2016-02-08 NOTE — Assessment & Plan Note (Signed)
I doubt allergy vaccine was prevented current outbreak of urticaria

## 2016-02-08 NOTE — Assessment & Plan Note (Signed)
Recent urticaria might have been triggered by something outdoors or by sunlight exposure. Discussed possible photosensitization. Plan-for now change protonix to an H2 antihistamine-Pepcid. Consider adding Singulair. Can try low-dose steroid or Xolair as future options if needed.

## 2016-04-01 ENCOUNTER — Other Ambulatory Visit: Payer: Self-pay | Admitting: Internal Medicine

## 2016-04-24 ENCOUNTER — Other Ambulatory Visit: Payer: Self-pay | Admitting: Obstetrics and Gynecology

## 2016-04-26 NOTE — Telephone Encounter (Signed)
Medication refill request: Nuvaring  Last AEX:  04-24-15  Next AEX: 05-19-16  Last MMG (if hormonal medication request): N/A Refill authorized: please advise

## 2016-05-19 ENCOUNTER — Encounter: Payer: Self-pay | Admitting: Obstetrics and Gynecology

## 2016-05-19 ENCOUNTER — Ambulatory Visit (INDEPENDENT_AMBULATORY_CARE_PROVIDER_SITE_OTHER): Payer: BLUE CROSS/BLUE SHIELD | Admitting: Obstetrics and Gynecology

## 2016-05-19 VITALS — BP 110/60 | HR 66 | Resp 14 | Ht 63.5 in | Wt 149.2 lb

## 2016-05-19 DIAGNOSIS — Z01419 Encounter for gynecological examination (general) (routine) without abnormal findings: Secondary | ICD-10-CM

## 2016-05-19 DIAGNOSIS — Z0000 Encounter for general adult medical examination without abnormal findings: Secondary | ICD-10-CM

## 2016-05-19 LAB — POCT URINALYSIS DIPSTICK
Bilirubin, UA: NEGATIVE
Blood, UA: NEGATIVE
Glucose, UA: NEGATIVE
Ketones, UA: NEGATIVE
Leukocytes, UA: NEGATIVE
Nitrite, UA: NEGATIVE
Protein, UA: NEGATIVE
Urobilinogen, UA: NEGATIVE
pH, UA: 6

## 2016-05-19 MED ORDER — NORETHINDRONE 0.35 MG PO TABS: 1.0000 | ORAL_TABLET | Freq: Every day | ORAL | 3 refills | Status: DC

## 2016-05-19 NOTE — Progress Notes (Signed)
40 y.o. G1P0000 Married Caucasian female here for annual exam.    Tried the Estrace cream and had migraine headache with blurry vision.  This might have been during her menstrual week.   Has numbness in left face and visual changes prior to migraine occurring.  Uses Imitrex prescribed through PCP.   Using NuvaRing.  Declines future childbearing.   Had cholecystectomy.   Stopped allergy injections.   Trying to loose weight.   PCP:  Clint Bolder. V (new physician)   Patient's last menstrual period was 05/11/2016.           Sexually active: Yes.    The current method of family planning is NuvaRing vaginal inserts.    Exercising: Yes.    Walking Smoker:  no  Health Maintenance: Pap:  02/02/14 WNL neg HR HPV History of abnormal Pap:  no MMG:  n/a Colonoscopy:  n/a BMD:   n/a  Result  n/a TDaP:  09/2015 Gardasil:   no HIV: Unsure Hep C: Unsure Screening Labs: PCP  Hb today: PCP, Urine today: neg   reports that she has never smoked. She has never used smokeless tobacco. She reports that she does not drink alcohol or use drugs.  Past Medical History:  Diagnosis Date  . Allergy-induced asthma    prn inhaler  . Cholecystitis 05/2015  . Complication of anesthesia    states unable to breathe when awoke from clavicle fx. surgery - had to be given neb. tx.  . Depression   . Environmental allergies   . Family history of adverse reaction to anesthesia    states mother is hard to wake up post-op  . Fibromyalgia   . GERD (gastroesophageal reflux disease)   . History of compression fracture of vertebral column 02/2014   T-12  . Migraines    with aura.    Past Surgical History:  Procedure Laterality Date  . CHOLECYSTECTOMY N/A 05/21/2015   Procedure: LAPAROSCOPIC CHOLECYSTECTOMY WITH INTRAOPERATIVE CHOLANGIOGRAM;  Surgeon: Donnie Mesa, MD;  Location: [facility];  Service: General;  Laterality: N/A;  . LAPAROSCOPIC APPENDECTOMY  06/18/2012   Procedure: APPENDECTOMY LAPAROSCOPIC;  Surgeon: Odis Hollingshead, MD;  Location: [facility];  Service: General;  Laterality: N/A;  . ORIF CLAVICLE FRACTURE Left 2000  . PERINEAL LACERATION REPAIR  1980    Current Outpatient Prescriptions  Medication Sig Dispense Refill  . ADVAIR DISKUS 100-50 MCG/DOSE AEPB USE 1 PUFF EVERY 12 HOURS 60 each 1  . albuterol (PROVENTIL HFA;VENTOLIN HFA) 108 (90 Base) MCG/ACT inhaler 2 puffs every 4-6 hours as needed 1 Inhaler 12  . azelastine (ASTELIN) 0.1 % nasal spray Place 1 spray into both nostrils daily. Use in each nostril as directed    . buPROPion (WELLBUTRIN XL) 300 MG 24 hr tablet Take 300 mg by mouth daily.      . cetirizine (ZYRTEC) 10 MG tablet Take 10 mg by mouth daily.    Marland Kitchen EPINEPHrine 0.3 mg/0.3 mL IJ SOAJ injection Inject 0.3 mLs (0.3 mg total) into the muscle once. 1 Device 11  . fluticasone (FLONASE) 50 MCG/ACT nasal spray USE 1 TO 2 SPRAYS IN EACH NOSTRIL ONCE DAILY AT BEDTIME 16 g 5  . NONFORMULARY OR COMPOUNDED ITEM Allergy Vaccine 1:10 Given at Home    . norethindrone (MICRONOR,CAMILA,ERRIN) 0.35 MG tablet Take 1 tablet (0.35 mg total) by mouth daily. 3 Package 3  . pantoprazole (PROTONIX) 40 MG tablet Take 1 tablet by mouth daily.  4  . SYRINGE/NEEDLE, DISP, 1 ML  25G X 5/8" 1 ML MISC Use as directed with allergy vaccine 100 each 3  . topiramate (TOPAMAX) 50 MG tablet Take 50 mg by mouth at bedtime.    Marland Kitchen zolpidem (AMBIEN) 10 MG tablet Take 1 tablet by mouth at bedtime.  1   No current facility-administered medications for this visit.     Family History  Problem Relation Age of Onset  . Gallbladder disease Father   . Mental illness Mother   . Hyperlipidemia Mother   . Breast cancer Mother 66  . Anesthesia problems Mother     hard to wake up post-op  . Colon cancer Maternal Grandfather   . Leukemia Paternal Grandfather   . Alzheimer's disease Paternal Grandfather     ROS:  Pertinent items are noted in HPI.  Otherwise, a  comprehensive ROS was negative.  Exam:   BP 110/60 (BP Location: Right Arm, Patient Position: Sitting, Cuff Size: Normal)   Pulse 66   Resp 14   Ht 5' 3.5" (1.613 m)   Wt 149 lb 3.2 oz (67.7 kg)   LMP 05/11/2016   BMI 26.02 kg/m     General appearance: alert, cooperative and appears stated age Head: Normocephalic, without obvious abnormality, atraumatic Neck: no adenopathy, supple, symmetrical, trachea midline and thyroid normal to inspection and palpation Lungs: clear to auscultation bilaterally Breasts: normal appearance, no masses or tenderness, No nipple retraction or dimpling, No nipple discharge or bleeding, No axillary or supraclavicular adenopathy Heart: regular rate and rhythm Abdomen: soft, non-tender; no masses, no organomegaly Extremities: extremities normal, atraumatic, no cyanosis or edema Skin: Skin color, texture, turgor normal. No rashes or lesions Lymph nodes: Cervical, supraclavicular, and axillary nodes normal. No abnormal inguinal nodes palpated Neurologic: Grossly normal  Pelvic: External genitalia:  no lesions              Urethra:  normal appearing urethra with no masses, tenderness or lesions              Bartholins and Skenes: normal                 Vagina: normal appearing vagina with normal color and discharge, no lesions              Cervix: no lesions              Pap taken: No. Bimanual Exam:  Uterus:  normal size, contour, position, consistency, mobility, non-tender              Adnexa: no mass, fullness, tenderness         Chaperone was present for exam.  Assessment:   Well woman visit with normal exam. Migraine HAs with aura.  Mother with breast cancer.   Plan: Yearly mammogram recommended after age 75.  Recommended self breast exam.  Pap and HR HPV as above. Discussed Calcium, Vitamin D, regular exercise program including cardiovascular and weight bearing exercise. Stop NuvaRing due to increased risk of stroke and use of combined oral  contraception.  Will switch to Micronor.  Instructed in use.  Follow up annually and prn.     After visit summary provided.

## 2016-05-19 NOTE — Patient Instructions (Signed)

## 2016-05-20 ENCOUNTER — Encounter: Payer: Self-pay | Admitting: Obstetrics and Gynecology

## 2016-07-07 ENCOUNTER — Other Ambulatory Visit: Payer: Self-pay | Admitting: Internal Medicine

## 2016-08-05 ENCOUNTER — Other Ambulatory Visit: Payer: Self-pay | Admitting: Internal Medicine

## 2016-09-06 HISTORY — PX: BREAST SURGERY: SHX581

## 2016-09-06 HISTORY — PX: BREAST BIOPSY: SHX20

## 2017-03-13 ENCOUNTER — Other Ambulatory Visit: Payer: Self-pay | Admitting: Internal Medicine

## 2017-05-18 ENCOUNTER — Other Ambulatory Visit: Payer: Self-pay | Admitting: Internal Medicine

## 2017-05-18 DIAGNOSIS — Z1231 Encounter for screening mammogram for malignant neoplasm of breast: Secondary | ICD-10-CM

## 2017-05-23 NOTE — Progress Notes (Signed)
41 y.o. G50P0000 Married Caucasian female here for annual exam.    12 pound weight gain.   Off of Micronor.  Had concerns about use of this with Topamax.   Menses are light.   Working as a [employer] social research officer.  Increase stress.  Wants to start riding horses again.   Having vulvar itching.  Monistat has caused burning in the past.  States her bottom is sensitive and she needs to be careful about soaps she uses.  Labs with PCP.   PCP:  Dr. Jossie Ng at [facility]   Patient's last menstrual period was 05/03/2017.           Sexually active: Yes.    The current method of family planning is none.    Exercising: No.  The patient does not participate in regular exercise at present. Smoker:  no  Health Maintenance: Pap:  02-02-13 Neg:Neg HR HPV, 01-20-10 Neg History of abnormal Pap:  no MMG: Never--Appt. 05-27-17 Colonoscopy:  Done in early 20's per patient  BMD:   n/a  Result  n/a TDaP:  09/2015 Gardasil:   no MOQ:HUTMLY Hep C:unsure Screening Labs:  PCP takes care of labs   reports that she has never smoked. She has never used smokeless tobacco. She reports that she drinks about 1.2 oz of alcohol per week . She reports that she does not use drugs.  Past Medical History:  Diagnosis Date  . Allergy-induced asthma    prn inhaler  . Cholecystitis 05/2015  . Complication of anesthesia    states unable to breathe when awoke from clavicle fx. surgery - had to be given neb. tx.  . Depression   . Environmental allergies   . Family history of adverse reaction to anesthesia    states mother is hard to wake up post-op  . Fibromyalgia   . GERD (gastroesophageal reflux disease)   . History of compression fracture of vertebral column 02/2014   T-12  . Migraines    with aura.    Past Surgical History:  Procedure Laterality Date  . CHOLECYSTECTOMY N/A 05/21/2015   Procedure: LAPAROSCOPIC CHOLECYSTECTOMY WITH INTRAOPERATIVE CHOLANGIOGRAM;  Surgeon: Donnie Mesa, MD;  Location: [facility];  Service: General;  Laterality: N/A;  . LAPAROSCOPIC APPENDECTOMY  06/18/2012   Procedure: APPENDECTOMY LAPAROSCOPIC;  Surgeon: Odis Hollingshead, MD;  Location: [facility];  Service: General;  Laterality: N/A;  . ORIF CLAVICLE FRACTURE Left 2000  . PERINEAL LACERATION REPAIR  1980    Current Outpatient Prescriptions  Medication Sig Dispense Refill  . ADVAIR DISKUS 100-50 MCG/DOSE AEPB USE 1 PUFF EVERY 12 HOURS 60 each 1  . azelastine (ASTELIN) 0.1 % nasal spray USE 1 TO 2 PUFFS IN EACH NOSTRIL ONCE TO TWICE DAILY AS NEEDED 30 mL 2  . buPROPion (WELLBUTRIN XL) 300 MG 24 hr tablet Take 300 mg by mouth daily.      . cetirizine (ZYRTEC) 10 MG tablet Take 10 mg by mouth daily.    Marland Kitchen EPINEPHrine 0.3 mg/0.3 mL IJ SOAJ injection Inject 0.3 mLs (0.3 mg total) into the muscle once. 1 Device 11  . fluticasone (FLONASE) 50 MCG/ACT nasal spray USE 1 TO 2 SPRAYS IN EACH NOSTRIL ONCE DAILY AT BEDTIME 16 g 1  . levalbuterol (XOPENEX HFA) 45 MCG/ACT inhaler Inhale 2 puffs into the lungs as needed for wheezing.    . Naproxen Sodium 220 MG CAPS Take by mouth as needed.    . NONFORMULARY OR COMPOUNDED ITEM Allergy Vaccine 1:10  Given at Home    . QUEtiapine (SEROQUEL) 25 MG tablet Take 1 tablet by mouth at bedtime.  0  . ranitidine (ZANTAC) 300 MG tablet Take 300 mg by mouth at bedtime.    . SYRINGE/NEEDLE, DISP, 1 ML 25G X 5/8" 1 ML MISC Use as directed with allergy vaccine 100 each 3  . topiramate (TOPAMAX) 100 MG tablet Take 100 mg by mouth at bedtime.     Marland Kitchen zolpidem (AMBIEN) 10 MG tablet Take 1 tablet by mouth at bedtime.  1   No current facility-administered medications for this visit.     Family History  Problem Relation Age of Onset  . Gallbladder disease Father   . Mental illness Mother   . Hyperlipidemia Mother   . Breast cancer Mother 73  . Anesthesia problems Mother        hard to wake up post-op  . Colon cancer Maternal Grandfather   . Leukemia Paternal Grandfather   .  Alzheimer's disease Paternal Grandfather     ROS:  Pertinent items are noted in HPI.  Otherwise, a comprehensive ROS was negative.  Exam:   BP 114/70 (BP Location: Right Arm, Patient Position: Sitting, Cuff Size: Normal)   Pulse 80   Resp 16   Ht 5' 3.75" (1.619 m)   Wt 160 lb (72.6 kg)   LMP 05/03/2017   BMI 27.68 kg/m     General appearance: alert, cooperative and appears stated age Head: Normocephalic, without obvious abnormality, atraumatic Neck: no adenopathy, supple, symmetrical, trachea midline and thyroid normal to inspection and palpation Lungs: clear to auscultation bilaterally Breasts: normal appearance, no masses or tenderness, No nipple retraction or dimpling, No nipple discharge or bleeding, No axillary or supraclavicular adenopathy Heart: regular rate and rhythm Abdomen: soft, non-tender; no masses, no organomegaly Extremities: extremities normal, atraumatic, no cyanosis or edema Skin: Skin color, texture, turgor normal. No rashes or lesions Lymph nodes: Cervical, supraclavicular, and axillary nodes normal. No abnormal inguinal nodes palpated Neurologic: Grossly normal  Pelvic: External genitalia:   Vulvar erythema.               Urethra:  normal appearing urethra with no masses, tenderness or lesions              Bartholins and Skenes: normal                 Vagina: normal appearing vagina with clumpy discharge.              Cervix: no lesions              Pap taken: Yes.   Bimanual Exam:  Uterus:  normal size, contour, position, consistency, mobility, non-tender              Adnexa: no mass, fullness, tenderness              Rectal exam: Yes.  .  Confirms.              Anus:  normal sphincter tone, no lesions  Chaperone was present for exam.  Assessment:   Well woman visit with normal exam. Migraine with aura.  Mother with breast cancer.  Vulvitis.   Plan: Mammogram screening discussed 3D discussed.  Recommended self breast awareness. Pap and HR HPV  as above. Guidelines for Calcium, Vitamin D, regular exercise program including cardiovascular and weight bearing exercise. Vaginitis testing from pap.  Mycolog II.  Follow up annually and prn.   After visit summary provided.

## 2017-05-25 ENCOUNTER — Other Ambulatory Visit (HOSPITAL_COMMUNITY)
Admission: RE | Admit: 2017-05-25 | Discharge: 2017-05-25 | Disposition: A | Discharge status: Home / Self Care | Payer: BLUE CROSS/BLUE SHIELD | Source: Ambulatory Visit | Attending: Obstetrics and Gynecology | Admitting: Obstetrics and Gynecology

## 2017-05-25 ENCOUNTER — Ambulatory Visit (INDEPENDENT_AMBULATORY_CARE_PROVIDER_SITE_OTHER): Payer: BLUE CROSS/BLUE SHIELD | Admitting: Obstetrics and Gynecology

## 2017-05-25 ENCOUNTER — Encounter: Payer: Self-pay | Admitting: Obstetrics and Gynecology

## 2017-05-25 VITALS — BP 114/70 | HR 80 | Resp 16 | Ht 63.75 in | Wt 160.0 lb

## 2017-05-25 DIAGNOSIS — N760 Acute vaginitis: Secondary | ICD-10-CM | POA: Diagnosis not present

## 2017-05-25 DIAGNOSIS — Z01419 Encounter for gynecological examination (general) (routine) without abnormal findings: Secondary | ICD-10-CM

## 2017-05-25 MED ORDER — NYSTATIN-TRIAMCINOLONE 100000-0.1 UNIT/GM-% EX CREA
1.0000 | TOPICAL_CREAM | Freq: Two times a day (BID) | CUTANEOUS | 0 refills | Status: DC
Start: 2017-05-25 — End: 2020-10-09

## 2017-05-25 NOTE — Patient Instructions (Addendum)
EXERCISE AND DIET:  We recommended that you start or continue a regular exercise program for good health. Regular exercise means any activity that makes your heart beat faster and makes you sweat.  We recommend exercising at least 30 minutes per day at least 3 days a week, preferably 4 or 5.  We also recommend a diet low in fat and sugar.  Inactivity, poor dietary choices and obesity can cause diabetes, heart attack, stroke, and kidney damage, among others.    ALCOHOL AND SMOKING:  Women should limit their alcohol intake to no more than 7 drinks/beers/glasses of wine (combined, not each!) per week. Moderation of alcohol intake to this level decreases your risk of breast cancer and liver damage. And of course, no recreational drugs are part of a healthy lifestyle.  And absolutely no smoking or even second hand smoke. Most people know smoking can cause heart and lung diseases, but did you know it also contributes to weakening of your bones? Aging of your skin?  Yellowing of your teeth and nails?  CALCIUM AND VITAMIN D:  Adequate intake of calcium and Vitamin D are recommended.  The recommendations for exact amounts of these supplements seem to change often, but generally speaking 600 mg of calcium (either carbonate or citrate) and 800 units of Vitamin D per day seems prudent. Certain women may benefit from higher intake of Vitamin D.  If you are among these women, your doctor will have told you during your visit.    PAP SMEARS:  Pap smears, to check for cervical cancer or precancers,  have traditionally been done yearly, although recent scientific advances have shown that most women can have pap smears less often.  However, every woman still should have a physical exam from her gynecologist every year. It will include a breast check, inspection of the vulva and vagina to check for abnormal growths or skin changes, a visual exam of the cervix, and then an exam to evaluate the size and shape of the uterus and  ovaries.  And after 40 years of age, a rectal exam is indicated to check for rectal cancers. We will also provide age appropriate advice regarding health maintenance, like when you should have certain vaccines, screening for sexually transmitted diseases, bone density testing, colonoscopy, mammograms, etc.   MAMMOGRAMS:  All women over 40 years old should have a yearly mammogram. Many facilities now offer a "3D" mammogram, which may cost around $50 extra out of pocket. If possible,  we recommend you accept the option to have the 3D mammogram performed.  It both reduces the number of women who will be called back for extra views which then turn out to be normal, and it is better than the routine mammogram at detecting truly abnormal areas.    COLONOSCOPY:  Colonoscopy to screen for colon cancer is recommended for all women at age 50.  We know, you hate the idea of the prep.  We agree, BUT, having colon cancer and not knowing it is worse!!  Colon cancer so often starts as a polyp that can be seen and removed at colonscopy, which can quite literally save your life!  And if your first colonoscopy is normal and you have no family history of colon cancer, most women don't have to have it again for 10 years.  Once every ten years, you can do something that may end up saving your life, right?  We will be happy to help you get it scheduled when you are ready.    Be sure to check your insurance coverage so you understand how much it will cost.  It may be covered as a preventative service at no cost, but you should check your particular policy.      Intrauterine Device Information An intrauterine device (IUD) is inserted into your uterus to prevent pregnancy. There are two types of IUDs available:  Copper IUD-This type of IUD is wrapped in copper wire and is placed inside the uterus. Copper makes the uterus and fallopian tubes produce a fluid that kills sperm. The copper IUD can stay in place for 10 years.  Hormone  IUD-This type of IUD contains the hormone progestin (synthetic progesterone). The hormone thickens the cervical mucus and prevents sperm from entering the uterus. It also thins the uterine lining to prevent implantation of a fertilized egg. The hormone can weaken or kill the sperm that get into the uterus. One type of hormone IUD can stay in place for 5 years, and another type can stay in place for 3 years.  Your health care provider will make sure you are a good candidate for a contraceptive IUD. Discuss with your health care provider the possible side effects. Advantages of an intrauterine device  IUDs are highly effective, reversible, long acting, and low maintenance.  There are no estrogen-related side effects.  An IUD can be used when breastfeeding.  IUDs are not associated with weight gain.  The copper IUD works immediately after insertion.  The hormone IUD works right away if inserted within 7 days of your period starting. You will need to use a backup method of birth control for 7 days if the hormone IUD is inserted at any other time in your cycle.  The copper IUD does not interfere with your female hormones.  The hormone IUD can make heavy menstrual periods lighter and decrease cramping.  The hormone IUD can be used for 3 or 5 years.  The copper IUD can be used for 10 years. Disadvantages of an intrauterine device  The hormone IUD can be associated with irregular bleeding patterns.  The copper IUD can make your menstrual flow heavier and more painful.  You may experience cramping and vaginal bleeding after insertion. This information is not intended to replace advice given to you by your health care provider. Make sure you discuss any questions you have with your health care provider. Document Released: 07/27/2004 Document Revised: 01/29/2016 Document Reviewed: 02/11/2013 Elsevier Interactive Patient Education  2017 [employer]  

## 2017-05-27 ENCOUNTER — Ambulatory Visit
Admission: RE | Admit: 2017-05-27 | Discharge: 2017-05-27 | Disposition: A | Discharge status: Home / Self Care | Payer: BLUE CROSS/BLUE SHIELD | Source: Ambulatory Visit | Attending: Internal Medicine | Admitting: Internal Medicine

## 2017-05-27 DIAGNOSIS — Z1231 Encounter for screening mammogram for malignant neoplasm of breast: Secondary | ICD-10-CM

## 2017-05-30 ENCOUNTER — Other Ambulatory Visit: Payer: Self-pay | Admitting: Internal Medicine

## 2017-05-30 DIAGNOSIS — R928 Other abnormal and inconclusive findings on diagnostic imaging of breast: Secondary | ICD-10-CM

## 2017-05-30 LAB — CYTOLOGY - PAP
Bacterial vaginitis: NEGATIVE
Candida vaginitis: NEGATIVE
Diagnosis: NEGATIVE
HPV: NOT DETECTED
Trichomonas: NEGATIVE

## 2017-06-02 ENCOUNTER — Ambulatory Visit
Admission: RE | Admit: 2017-06-02 | Discharge: 2017-06-02 | Disposition: A | Discharge status: Home / Self Care | Payer: BLUE CROSS/BLUE SHIELD | Source: Ambulatory Visit | Attending: Internal Medicine | Admitting: Internal Medicine

## 2017-06-02 ENCOUNTER — Other Ambulatory Visit: Payer: Self-pay | Admitting: Internal Medicine

## 2017-06-02 DIAGNOSIS — R928 Other abnormal and inconclusive findings on diagnostic imaging of breast: Secondary | ICD-10-CM

## 2017-06-02 DIAGNOSIS — N6320 Unspecified lump in the left breast, unspecified quadrant: Secondary | ICD-10-CM

## 2017-06-05 ENCOUNTER — Other Ambulatory Visit: Payer: Self-pay | Admitting: Internal Medicine

## 2017-06-07 ENCOUNTER — Ambulatory Visit
Admission: RE | Admit: 2017-06-07 | Discharge: 2017-06-07 | Disposition: A | Discharge status: Home / Self Care | Payer: BLUE CROSS/BLUE SHIELD | Source: Ambulatory Visit | Attending: Internal Medicine | Admitting: Internal Medicine

## 2017-06-07 ENCOUNTER — Other Ambulatory Visit: Payer: Self-pay | Admitting: Internal Medicine

## 2017-06-07 DIAGNOSIS — N6320 Unspecified lump in the left breast, unspecified quadrant: Secondary | ICD-10-CM

## 2017-08-09 ENCOUNTER — Telehealth: Payer: Self-pay

## 2017-08-09 ENCOUNTER — Encounter: Payer: Self-pay | Admitting: Obstetrics and Gynecology

## 2017-08-09 NOTE — Telephone Encounter (Signed)
Visit Follow-Up Question  Message [id number]  From Bernarda, Erck To Nunzio Cobbs, MD Sent 08/09/2017 7:03 AM  I just started getting itchy again Friday. I just use the cream you prescribed, or could you also prescribe the diflucan to see if that helps to knock it out faster. It has been staying about the same, without getting worse. Night is really irritating. I still have the Estrogen cream should I try that? If sending prescription [facility] to [facility].   Responsible Party   Pool - [facility] Pool Message taken by Gwendlyn Deutscher, RN on 08/09/2017 10:16 AM  No actions have been taken on this message.   Patient was diagnosed with vulvitis at her [facility] on 05/25/2017. Given rx for Mycolog II. Patient started using Mycolog II on 08/05/2017 without change in symptoms. Patient is requesting further recommendations. Asking if she may try using Estrogen cream at this time.

## 2017-08-09 NOTE — Telephone Encounter (Signed)
Telephone encounter created to review with Dr.Silva.

## 2017-08-09 NOTE — Telephone Encounter (Signed)
Spoke with patient. Patient will be out of town for work tomorrow and cannot be seen. Available on 08/11/2017. Appointment scheduled for 08/11/2017 at 1:30 pm with Dr.Silva. Patient is agreeable to date and time. Encounter closed.

## 2017-08-09 NOTE — Telephone Encounter (Signed)
Please make an appointment for me to see the patient tomorrow at 12:30.

## 2017-08-11 ENCOUNTER — Ambulatory Visit: Payer: BLUE CROSS/BLUE SHIELD | Admitting: Obstetrics and Gynecology

## 2017-08-12 ENCOUNTER — Encounter (HOSPITAL_COMMUNITY): Payer: Self-pay | Admitting: Radiology

## 2017-08-12 ENCOUNTER — Encounter (HOSPITAL_COMMUNITY): Payer: Self-pay

## 2017-08-12 ENCOUNTER — Emergency Department (HOSPITAL_COMMUNITY): Payer: BLUE CROSS/BLUE SHIELD

## 2017-08-12 ENCOUNTER — Other Ambulatory Visit: Payer: Self-pay

## 2017-08-12 ENCOUNTER — Emergency Department (HOSPITAL_COMMUNITY)
Admission: EM | Admit: 2017-08-12 | Discharge: 2017-08-12 | Disposition: A | Discharge status: Home / Self Care | Payer: BLUE CROSS/BLUE SHIELD | Attending: Emergency Medicine | Admitting: Emergency Medicine

## 2017-08-12 DIAGNOSIS — R1012 Left upper quadrant pain: Secondary | ICD-10-CM

## 2017-08-12 DIAGNOSIS — K2900 Acute gastritis without bleeding: Secondary | ICD-10-CM | POA: Diagnosis not present

## 2017-08-12 DIAGNOSIS — Z79899 Other long term (current) drug therapy: Secondary | ICD-10-CM | POA: Diagnosis not present

## 2017-08-12 DIAGNOSIS — R112 Nausea with vomiting, unspecified: Secondary | ICD-10-CM | POA: Insufficient documentation

## 2017-08-12 DIAGNOSIS — R197 Diarrhea, unspecified: Secondary | ICD-10-CM | POA: Insufficient documentation

## 2017-08-12 DIAGNOSIS — Z91040 Latex allergy status: Secondary | ICD-10-CM | POA: Insufficient documentation

## 2017-08-12 DIAGNOSIS — R1032 Left lower quadrant pain: Secondary | ICD-10-CM | POA: Diagnosis not present

## 2017-08-12 HISTORY — DX: Fracture of unspecified part of unspecified clavicle, initial encounter for closed fracture: S42.009A

## 2017-08-12 LAB — COMPREHENSIVE METABOLIC PANEL
ALT: 25 U/L (ref 14–54)
AST: 24 U/L (ref 15–41)
Albumin: 4.4 g/dL (ref 3.5–5.0)
Alkaline Phosphatase: 127 U/L — ABNORMAL HIGH (ref 38–126)
Anion gap: 11 (ref 5–15)
BUN: 15 mg/dL (ref 6–20)
CO2: 20 mmol/L — ABNORMAL LOW (ref 22–32)
Calcium: 9.1 mg/dL (ref 8.9–10.3)
Chloride: 105 mmol/L (ref 101–111)
Creatinine, Ser: 0.79 mg/dL (ref 0.44–1.00)
GFR calc Af Amer: >60 mL/min (ref >60)
GFR calc non Af Amer: >60 mL/min (ref >60)
Glucose, Bld: 82 mg/dL (ref 65–99)
Potassium: 3.8 mmol/L (ref 3.5–5.1)
Sodium: 136 mmol/L (ref 135–145)
Total Bilirubin: 0.8 mg/dL (ref 0.3–1.2)
Total Protein: 7.8 g/dL (ref 6.5–8.1)

## 2017-08-12 LAB — CBC
HCT: 43.6 % (ref 36.0–46.0)
Hemoglobin: 14.9 g/dL (ref 12.0–15.0)
MCH: 30.2 pg (ref 26.0–34.0)
MCHC: 34.2 g/dL (ref 30.0–36.0)
MCV: 88.3 fL (ref 78.0–100.0)
Platelets: 300 10*3/uL (ref 150–400)
RBC: 4.94 MIL/uL (ref 3.87–5.11)
RDW: 12.4 % (ref 11.5–15.5)
WBC: 12.6 10*3/uL — ABNORMAL HIGH (ref 4.0–10.5)

## 2017-08-12 LAB — LIPASE, BLOOD: Lipase: 21 U/L (ref 11–51)

## 2017-08-12 LAB — I-STAT BETA HCG BLOOD, ED (MC, WL, AP ONLY): I-stat hCG, quantitative: <5 m[IU]/mL (ref <5)

## 2017-08-12 MED ORDER — PANTOPRAZOLE SODIUM 40 MG IV SOLR
40.0000 mg | Freq: Once | INTRAVENOUS | Status: AC
  Administered 2017-08-12: 40 mg via INTRAVENOUS
  Filled 2017-08-12: qty 40

## 2017-08-12 MED ORDER — SUCRALFATE 1 G PO TABS: 1.0000 g | ORAL_TABLET | Freq: Three times a day (TID) | ORAL | 0 refills | Status: DC

## 2017-08-12 MED ORDER — IOPAMIDOL (ISOVUE-300) INJECTION 61%
INTRAVENOUS | Status: AC
  Administered 2017-08-12: 100 mL via INTRAVENOUS
  Filled 2017-08-12: qty 100

## 2017-08-12 MED ORDER — SODIUM CHLORIDE 0.9 % IV BOLUS (SEPSIS)
1000.0000 mL | Freq: Once | INTRAVENOUS | Status: AC
  Administered 2017-08-12: 1000 mL via INTRAVENOUS

## 2017-08-12 NOTE — ED Provider Notes (Signed)
[facility] DEPT Provider Note   CSN: [id number] Arrival date & time: 08/12/17  1356     History   Chief Complaint Chief Complaint  Patient presents with  . Abdominal Pain  . Nausea  . Diarrhea    HPI Monica Hansen is a 41 y.o. female.  The history is provided by the patient. No language interpreter was used.  Abdominal Pain   Associated symptoms include diarrhea.  Diarrhea   Associated symptoms include abdominal pain.    Monica Hansen is a 41 y.o. female who presents to the Emergency Department complaining of AP.  She reports abdominal pain that began on Monday of this week.  Initially she had significant diarrhea with nausea and vomiting with abdominal cramping.  Over the course of the week her diarrhea and vomiting have improved but her abdominal pain has significantly worsened.  Pain is predominantly in the left upper quadrant and epigastrium.  [facility] CBC performed that demonstrated leukocytosis and she was instructed to go to the emergency department for further evaluation.  No reports of fevers, dysuria.  She does have a history of prior appendectomy and cholecystectomy.  Past Medical History:  Diagnosis Date  . Allergy-induced asthma    prn inhaler  . Cholecystitis 05/2015  . Collar bone fracture   . Complication of anesthesia    states unable to breathe when awoke from clavicle fx. surgery - had to be given neb. tx.  . Depression   . Environmental allergies   . Family history of adverse reaction to anesthesia    states mother is hard to wake up post-op  . Fibromyalgia   . GERD (gastroesophageal reflux disease)   . History of compression fracture of vertebral column 02/2014   T-12  . Migraines    with aura.    Patient Active Problem List   Diagnosis Date Noted  . Rash and nonspecific skin eruption 07/04/2015  . Chronic left maxillary sinusitis 01/04/2015  . Allergic conjunctivitis  01/04/2015  . Vertebral fracture 02/17/2014  . Seasonal and perennial allergic rhinitis 06/30/2008  . DEPRESSION 12/22/2007  . Allergic-infective asthma 12/22/2007    Past Surgical History:  Procedure Laterality Date  . CHOLECYSTECTOMY N/A 05/21/2015   Procedure: LAPAROSCOPIC CHOLECYSTECTOMY WITH INTRAOPERATIVE CHOLANGIOGRAM;  Surgeon: Donnie Mesa, MD;  Location: [facility];  Service: General;  Laterality: N/A;  . LAPAROSCOPIC APPENDECTOMY  06/18/2012   Procedure: APPENDECTOMY LAPAROSCOPIC;  Surgeon: Odis Hollingshead, MD;  Location: [facility];  Service: General;  Laterality: N/A;  . ORIF CLAVICLE FRACTURE Left 2000  . PERINEAL LACERATION REPAIR  1980    OB History    Gravida Para Term Preterm AB Living   1 0 0 0 0 0   SAB TAB Ectopic Multiple Live Births   0 0 0 0         Home Medications    Prior to Admission medications   Medication Sig Start Date End Date Taking? Authorizing Provider  ADVAIR DISKUS 100-50 MCG/DOSE AEPB USE 1 PUFF EVERY 12 HOURS 06/02/15  Yes Young, Clinton D, MD  azelastine (ASTELIN) 0.1 % nasal spray USE 1 TO 2 PUFFS IN EACH NOSTRIL ONCE TO TWICE DAILY AS NEEDED Patient taking differently: USE 1 PUFF IN EACH NOSTRIL ONCE TO TWICE DAILY 03/14/17  Yes Young, Clinton D, MD  buPROPion (WELLBUTRIN XL) 300 MG 24 hr tablet Take 300 mg by mouth daily.     Yes  [provider]  cetirizine (ZYRTEC) 10 MG tablet Take 10 mg by mouth daily.   Yes [provider]  dicyclomine (BENTYL) 20 MG tablet Take 20 mg by mouth 3 (three) times daily as needed for spasms.  08/10/17  Yes [provider]  fluticasone (FLONASE) 50 MCG/ACT nasal spray USE 1 TO 2 SPRAYS IN EACH NOSTRIL ONCE DAILY AT BEDTIME Patient taking differently: USE 1 SPRAY IN EACH NOSTRIL ONCE DAILY 08/09/16  Yes Young, Tarri Fuller D, MD  levalbuterol [facility] HFA) 45 MCG/ACT inhaler Inhale 2 puffs into the lungs as needed for wheezing.   Yes [provider]  Naproxen Sodium  220 MG CAPS Take 440 mg by mouth daily as needed (back pain).    Yes [provider]  QUEtiapine (SEROQUEL) 25 MG tablet Take 1 tablet by mouth at bedtime as needed (sleep).  04/24/17  Yes [provider]  topiramate (TOPAMAX) 100 MG tablet Take 100 mg by mouth at bedtime.    Yes [provider]  azelastine (ASTELIN) 0.1 % nasal spray USE 1 TO 2 PUFFS IN EACH NOSTRIL ONCE TO TWICE DAILY AS NEEDED Patient not taking: Reported on 08/12/2017 06/06/17   Baird Lyons D, MD  EPINEPHrine 0.3 mg/0.3 mL IJ SOAJ injection Inject 0.3 mLs (0.3 mg total) into the muscle once. 07/03/15   Deneise Lever, MD  fluconazole (DIFLUCAN) 150 MG tablet  08/10/17   [provider]  NONFORMULARY OR COMPOUNDED ITEM Allergy Vaccine 1:10 Given at Home    [provider]  nystatin-triamcinolone (MYCOLOG II) cream Apply 1 application topically 2 (two) times daily. Apply to affected area BID for up to 7 days. Patient not taking: Reported on 08/12/2017 05/25/17   Nunzio Cobbs, MD  pantoprazole (PROTONIX) 40 MG tablet Take 40 mg by mouth daily as needed (indigestion).  05/17/17   [provider]  ranitidine (ZANTAC) 150 MG tablet TAKE 1 TABLET BY MOUTH EVERYDAY AT BEDTIME 05/18/17   [provider]  ranitidine (ZANTAC) 300 MG tablet Take 300 mg by mouth at bedtime.    [provider]  sucralfate (CARAFATE) 1 g tablet Take 1 tablet (1 g total) by mouth 4 (four) times daily -  with meals and at bedtime. 08/12/17   Quintella Reichert, MD  SYRINGE/NEEDLE, DISP, 1 ML 25G X 5/8" 1 ML MISC Use as directed with allergy vaccine 11/07/15   Baird Lyons D, MD  zolpidem (AMBIEN) 10 MG tablet Take 1 tablet by mouth at bedtime. 04/05/15   [provider]    Family History Family History  Problem Relation Age of Onset  . Gallbladder disease Father   . Mental illness Mother   . Hyperlipidemia Mother   . Breast cancer Mother 62  . Anesthesia problems  Mother        hard to wake up post-op  . Colon cancer Maternal Grandfather   . Leukemia Paternal Grandfather   . Alzheimer's disease Paternal Grandfather     Social History Social History   Tobacco Use  . Smoking status: Never Smoker  . Smokeless tobacco: Never Used  Substance Use Topics  . Alcohol use: Yes    Alcohol/week: 0.6 oz    Types: 1 Glasses of wine per week    Comment: wine every 3 weeks  . Drug use: No     Allergies   Strawberry extract; Erythromycin; Sulfonamide derivatives; Tetracyclines & related; Adhesive [tape]; Iodine; and Latex   Review of Systems Review of Systems  Gastrointestinal: Positive for abdominal pain and diarrhea.  All other systems reviewed and are negative.    Physical Exam Updated Vital Signs BP 103/71   Pulse 83   Temp 99.1 F (37.3 C) (Oral)   Resp 18   Ht 5\' 4"  (1.626 m)   Wt 70.3 kg (155 lb)   LMP 07/29/2017   SpO2 98%   Breastfeeding? Unknown   BMI 26.61 kg/m   Physical Exam  Constitutional: She is oriented to person, place, and time. She appears well-developed and well-nourished.  HENT:  Head: Normocephalic and atraumatic.  Cardiovascular: Normal rate and regular rhythm.  No murmur heard. Pulmonary/Chest: Effort normal and breath sounds normal. No respiratory distress.  Abdominal: Soft. There is tenderness.  Moderate LUQ and epigastric tenderness.  Mild LLQ tenderness  Musculoskeletal: She exhibits no edema or tenderness.  Neurological: She is alert and oriented to person, place, and time.  Skin: Skin is warm and dry.  Psychiatric: She has a normal mood and affect. Her behavior is normal.  Nursing note and vitals reviewed.    ED Treatments / Results  Labs (all labs ordered are listed, but only abnormal results are displayed) Labs Reviewed  COMPREHENSIVE METABOLIC PANEL - Abnormal; Notable for the following components:      Result Value   CO2 20 (*)    Alkaline Phosphatase 127 (*)    All other components  within normal limits  CBC - Abnormal; Notable for the following components:   WBC 12.6 (*)    All other components within normal limits  LIPASE, BLOOD  URINALYSIS, ROUTINE W REFLEX MICROSCOPIC  I-STAT BETA HCG BLOOD, ED ([facility], [facility], [facility] ONLY)    EKG  EKG Interpretation None       Radiology Ct Abdomen Pelvis W Contrast  Result Date: 08/12/2017 CLINICAL DATA:  Abdominal pain, diverticulitis suspected. EXAM: CT ABDOMEN AND PELVIS WITH CONTRAST TECHNIQUE: Multidetector CT imaging of the abdomen and pelvis was performed using the standard protocol following bolus administration of intravenous contrast. CONTRAST:  176mL ISOVUE-300 IOPAMIDOL (ISOVUE-300) INJECTION 61% COMPARISON:  CT of the abdomen and pelvis 07/05/2012 FINDINGS: Lower chest: The lung bases are clear without focal nodule, mass, or airspace disease. Heart size is normal. No significant pleural or pericardial effusion is present. Hepatobiliary: The liver is within normal limits. Cholecystectomy is noted. The common bile duct is unremarkable. Pancreas: Unremarkable. No pancreatic ductal dilatation or surrounding inflammatory changes. Spleen: Normal in size without focal abnormality. Adrenals/Urinary Tract: The adrenal glands are normal bilaterally. Kidneys and ureters are within normal limits bilaterally. The urinary bladder is within normal limits. Stomach/Bowel: The stomach and duodenum are normal. Small bowel is unremarkable. The patient is status post appendectomy. Terminal ileum is within normal limits. The ascending and transverse colon are within normal limits. Descending and sigmoid colon are normal. Vascular/Lymphatic: No significant vascular findings are present. No enlarged abdominal or pelvic lymph nodes. Reproductive: A 10 mm fibroid is present on the left side of the uterine fundus. Uterus is otherwise within normal limits. A peripherally enhancing follicle in the right ovary measures 15 mm, likely recently collapsed follicle. The  adnexa are normal bilaterally. Other: No abdominal wall hernia or abnormality. No abdominopelvic ascites. Musculoskeletal: A remote superior endplate T12 fracture is present. Vertebral body heights alignment are otherwise normal. Bony pelvis intact. Hips are located and within normal limits bilaterally. IMPRESSION: 1. No acute or focal lesion to explain the patient's abdominal pain. 2. Remote T12 fracture. Electronically Signed   By: San Morelle  M.D.   On: 08/12/2017 20:41    Procedures Procedures (including critical care time)  Medications Ordered in ED Medications  sodium chloride 0.9 % bolus 1,000 mL (0 mLs Intravenous Stopped 08/12/17 2215)  pantoprazole (PROTONIX) injection 40 mg (40 mg Intravenous Given 08/12/17 1944)  iopamidol (ISOVUE-300) 61 % injection (100 mLs Intravenous Contrast Given 08/12/17 2009)     Initial Impression / Assessment and Plan / ED Course  I have reviewed the triage vital signs and the nursing notes.  Pertinent labs & imaging results that were available during my care of the patient were reviewed by me and considered in my medical decision making (see chart for details).    Patient here for evaluation of abdominal pain, vomiting, diarrhea.  She is nontoxic appearing on examination with upper abdominal tenderness as well as left lower quadrant tenderness.  Labs demonstrate mild leukocytosis.  CT abdomen negative for acute abnormality.  Current presentation is not consistent with diverticulitis, pancreatitis, perforated peptic ulcer.  Discussed with patient possible gastritis.  Discussed continuing her Protonix as directed and will add Carafate for symptom management.  Discussed GI and PCP follow-up.  Home care and return precautions discussed.  Final Clinical Impressions(s) / ED Diagnoses   Final diagnoses:  Left upper quadrant pain  Other acute gastritis without hemorrhage    ED Discharge Orders        Ordered    sucralfate (CARAFATE) 1 g tablet  3  times daily with meals & bedtime     08/12/17 2157       Quintella Reichert, MD 08/13/17 [phone]

## 2017-08-16 ENCOUNTER — Telehealth: Payer: Self-pay | Admitting: Obstetrics and Gynecology

## 2017-08-16 NOTE — Telephone Encounter (Signed)
Called and spoke with patient to reschedule her "problem visit" with Dr. Quincy Simmonds on 08/17/17. Patient stated the problem has gone away and there is not a need to reschedule at this time. Patient aware to call if problem recurs. Routing to provider of the day for review.

## 2017-08-17 ENCOUNTER — Ambulatory Visit: Payer: BLUE CROSS/BLUE SHIELD | Admitting: Obstetrics and Gynecology

## 2017-09-09 ENCOUNTER — Other Ambulatory Visit: Payer: Self-pay | Admitting: Internal Medicine

## 2017-09-15 ENCOUNTER — Other Ambulatory Visit (HOSPITAL_COMMUNITY): Payer: Self-pay | Admitting: Gastroenterology

## 2017-09-15 DIAGNOSIS — R110 Nausea: Secondary | ICD-10-CM

## 2017-09-15 DIAGNOSIS — R1084 Generalized abdominal pain: Secondary | ICD-10-CM

## 2017-09-16 ENCOUNTER — Other Ambulatory Visit: Payer: Self-pay | Admitting: Internal Medicine

## 2017-09-26 ENCOUNTER — Ambulatory Visit (HOSPITAL_COMMUNITY)
Admission: RE | Admit: 2017-09-26 | Discharge: 2017-09-26 | Disposition: A | Discharge status: Home / Self Care | Payer: BLUE CROSS/BLUE SHIELD | Source: Ambulatory Visit | Attending: Gastroenterology | Admitting: Gastroenterology

## 2017-09-26 DIAGNOSIS — R110 Nausea: Secondary | ICD-10-CM | POA: Diagnosis not present

## 2017-09-26 DIAGNOSIS — R1084 Generalized abdominal pain: Secondary | ICD-10-CM

## 2017-09-26 MED ORDER — TECHNETIUM TC 99M SULFUR COLLOID FILTERED
1.0000 | Freq: Once | INTRAVENOUS | Status: AC | PRN
  Administered 2017-09-26: 1 via INTRADERMAL

## 2018-04-18 ENCOUNTER — Other Ambulatory Visit: Payer: Self-pay | Admitting: Internal Medicine

## 2018-04-18 DIAGNOSIS — Z1231 Encounter for screening mammogram for malignant neoplasm of breast: Secondary | ICD-10-CM

## 2018-05-26 NOTE — Progress Notes (Signed)
42 y.o. G67P0000 Married Caucasian female here for annual exam.  Patient states that she is having some vaginal itching.   No vaginal odor or irritation.  Symptoms started yesterday.  Had the same thing last year.  Did use cortisone cream this am.   Some stomach pain.  Pain with urine touching the outside of her vulva.   Increased pain with menses and heavier off of the Micronor.   Having GI issues.  Seeing Dr. Reesa Chew in [place].  Did endoscopy and colonoscopy for evaluation.  CT scan showed small uterine fibroid. IBS type diet.   Husband had an MI last month at age 28.   Labs with PCP.   PCP:   Dr. Jossie Ng at [place]   Patient's last menstrual period was 05/09/2018.           Sexually active: Yes.   Not sexually active due to husband's medical issues.  The current method of family planning is none.    Exercising: Yes.    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 3+ WBCs.    reports that she has never smoked. She has never used smokeless tobacco. She reports that she drinks about 1.0 standard drinks of alcohol per week. She reports that she does not use drugs.  Past Medical History:  Diagnosis Date  . Allergy-induced asthma    prn inhaler  . Cholecystitis 05/2015  . Collar bone fracture   . Complication of anesthesia    states unable to breathe when awoke from clavicle fx. surgery - had to be given neb. tx.  . Depression   . Environmental allergies   . Family history of adverse reaction to anesthesia    states mother is hard to wake up post-op  . Fibroid   . Fibromyalgia   . GERD (gastroesophageal reflux disease)   . History of compression fracture of vertebral  column 02/2014   T-12  . Migraines    with aura.    Past Surgical History:  Procedure Laterality Date  . BREAST SURGERY  2018   left breast bx - benign fibroadenoma  . CHOLECYSTECTOMY N/A 05/21/2015   Procedure: LAPAROSCOPIC CHOLECYSTECTOMY WITH INTRAOPERATIVE CHOLANGIOGRAM;  Surgeon: Donnie Mesa, MD;  Location: [facility];  Service: General;  Laterality: N/A;  . LAPAROSCOPIC APPENDECTOMY  06/18/2012   Procedure: APPENDECTOMY LAPAROSCOPIC;  Surgeon: Odis Hollingshead, MD;  Location: [facility];  Service: General;  Laterality: N/A;  . ORIF CLAVICLE FRACTURE Left 2000  . PERINEAL LACERATION REPAIR  1980    Current Outpatient Medications  Medication Sig Dispense Refill  . ADVAIR DISKUS 100-50 MCG/DOSE AEPB USE 1 PUFF EVERY 12 HOURS 60 each 1  . azelastine (ASTELIN) 0.1 % nasal spray USE 1 TO 2 PUFFS IN EACH NOSTRIL ONCE TO TWICE DAILY AS NEEDED (Patient taking differently: USE 1 PUFF IN EACH NOSTRIL ONCE TO TWICE DAILY) 30 mL 2  . buPROPion (WELLBUTRIN XL) 300 MG 24 hr tablet Take 300 mg by mouth daily.      . cetirizine (ZYRTEC) 10 MG tablet Take 10 mg by mouth daily.    Marland Kitchen  dicyclomine (BENTYL) 20 MG tablet Take 20 mg by mouth 3 (three) times daily as needed for spasms.   0  . EPINEPHrine 0.3 mg/0.3 mL IJ SOAJ injection Inject 0.3 mLs (0.3 mg total) into the muscle once. 1 Device 11  . fluconazole (DIFLUCAN) 150 MG tablet   0  . fluticasone (FLONASE) 50 MCG/ACT nasal spray USE 1 TO 2 SPRAYS IN EACH NOSTRIL ONCE DAILY AT BEDTIME (Patient taking differently: USE 1 SPRAY IN EACH NOSTRIL ONCE DAILY) 16 g 1  . levalbuterol (XOPENEX HFA) 45 MCG/ACT inhaler Inhale 2 puffs into the lungs as needed for wheezing.    . nystatin-triamcinolone (MYCOLOG II) cream Apply 1 application topically 2 (two) times daily. Apply to affected area BID for up to 7 days. 60 g 0  . pantoprazole (PROTONIX) 40 MG tablet Take 40 mg by mouth daily as needed (indigestion).   2  . QUEtiapine (SEROQUEL) 25 MG  tablet Take 1 tablet by mouth at bedtime as needed (sleep).   0  . topiramate (TOPAMAX) 100 MG tablet Take 100 mg by mouth at bedtime.     Marland Kitchen zolpidem (AMBIEN) 10 MG tablet Take 1 tablet by mouth at bedtime.  1  . azelastine (ASTELIN) 0.1 % nasal spray USE 1 TO 2 PUFFS IN EACH NOSTRIL ONCE TO TWICE DAILY AS NEEDED (Patient not taking: Reported on 08/12/2017) 30 mL 2  . Naproxen Sodium 220 MG CAPS Take 440 mg by mouth daily as needed (back pain).     . NONFORMULARY OR COMPOUNDED ITEM Allergy Vaccine 1:10 Given at Home    . ranitidine (ZANTAC) 150 MG tablet TAKE 1 TABLET BY MOUTH EVERYDAY AT BEDTIME  5  . ranitidine (ZANTAC) 300 MG tablet Take 300 mg by mouth at bedtime.    . sucralfate (CARAFATE) 1 g tablet Take 1 tablet (1 g total) by mouth 4 (four) times daily -  with meals and at bedtime. 60 tablet 0  . SYRINGE/NEEDLE, DISP, 1 ML 25G X 5/8" 1 ML MISC Use as directed with allergy vaccine 100 each 3   No current facility-administered medications for this visit.     Family History  Problem Relation Age of Onset  . Gallbladder disease Father   . Mental illness Mother   . Hyperlipidemia Mother   . Breast cancer Mother 41  . Anesthesia problems Mother        hard to wake up post-op  . Colon cancer Maternal Grandfather   . Leukemia Paternal Grandfather   . Alzheimer's disease Paternal Grandfather     Review of Systems  HENT: Positive for sinus pressure.   Genitourinary:       Vulvar itching   Skin:       Itching new mole     Exam:   BP 110/68 (BP Location: Right Arm, Patient Position: Sitting, Cuff Size: Normal)   Pulse 64   Resp 14   Ht 5' 3.75" (1.619 m)   Wt 154 lb (69.9 kg)   LMP 05/09/2018   BMI 26.64 kg/m     General appearance: alert, cooperative and appears stated age Head: Normocephalic, without obvious abnormality, atraumatic Neck: no adenopathy, supple, symmetrical, trachea midline and thyroid normal to inspection and palpation Lungs: clear to auscultation  bilaterally Breasts: normal appearance, no masses or tenderness, No nipple retraction or dimpling, No nipple discharge or bleeding, No axillary or supraclavicular adenopathy Heart: regular rate and rhythm Abdomen: soft, non-tender; no masses, no organomegaly Extremities: extremities normal, atraumatic, no cyanosis or  edema Skin: Skin color, texture, turgor normal. No rashes or lesions Lymph nodes: Cervical, supraclavicular, and axillary nodes normal. No abnormal inguinal nodes palpated Neurologic: Grossly normal  Pelvic: External genitalia:  no lesions              Urethra:  normal appearing urethra with no masses, tenderness or lesions              Bartholins and Skenes: normal                 Vagina: normal appearing vagina with normal color and discharge, no lesions              Cervix: no lesions              Pap taken: No. Bimanual Exam:  Uterus:  normal size, contour, position, consistency, mobility, non-tender              Adnexa: no mass, fullness, tenderness              Rectal exam: Yes.  .  Confirms.              Anus:  normal sphincter tone, no lesions  Chaperone was present for exam.  Assessment:   Well woman visit with normal exam. Migraine with aura.  Mother with breast cancer age 19. Uncertain family history.  Status post left breast biopsy - fibroadenoma.  Hx fibroid. Vaginitis.  Abnormal urine dip.   Plan: Mammogram screening. Recommended self breast awareness. Pap and HR HPV as above. Guidelines for Calcium, Vitamin D, regular exercise program including cardiovascular and weight bearing exercise. Affirm.  Urine micro and cx. Referral for genetic counseling and testing.  Declines Gardasil. Follow up annually and prn.    After visit summary provided.

## 2018-05-29 ENCOUNTER — Ambulatory Visit
Admission: RE | Admit: 2018-05-29 | Discharge: 2018-05-29 | Disposition: A | Discharge status: Home / Self Care | Payer: BLUE CROSS/BLUE SHIELD | Source: Ambulatory Visit | Attending: Internal Medicine | Admitting: Internal Medicine

## 2018-05-29 ENCOUNTER — Ambulatory Visit (INDEPENDENT_AMBULATORY_CARE_PROVIDER_SITE_OTHER): Payer: BLUE CROSS/BLUE SHIELD | Admitting: Obstetrics and Gynecology

## 2018-05-29 ENCOUNTER — Encounter: Payer: Self-pay | Admitting: Obstetrics and Gynecology

## 2018-05-29 VITALS — BP 110/68 | HR 64 | Resp 14 | Ht 63.75 in | Wt 154.0 lb

## 2018-05-29 DIAGNOSIS — N898 Other specified noninflammatory disorders of vagina: Secondary | ICD-10-CM

## 2018-05-29 DIAGNOSIS — Z1231 Encounter for screening mammogram for malignant neoplasm of breast: Secondary | ICD-10-CM

## 2018-05-29 DIAGNOSIS — Z01419 Encounter for gynecological examination (general) (routine) without abnormal findings: Secondary | ICD-10-CM | POA: Diagnosis not present

## 2018-05-29 DIAGNOSIS — Z803 Family history of malignant neoplasm of breast: Secondary | ICD-10-CM | POA: Diagnosis not present

## 2018-05-29 LAB — POCT URINALYSIS DIPSTICK
Bilirubin, UA: NEGATIVE
Glucose, UA: NEGATIVE
Ketones, UA: NEGATIVE
Nitrite, UA: NEGATIVE
Protein, UA: NEGATIVE
Urobilinogen, UA: 0.2 E.U./dL
pH, UA: 5 (ref 5.0–8.0)

## 2018-05-29 NOTE — Patient Instructions (Signed)

## 2018-05-30 LAB — URINALYSIS, MICROSCOPIC ONLY: Casts: NONE SEEN /lpf

## 2018-05-30 LAB — VAGINITIS/VAGINOSIS, DNA PROBE
Candida Species: POSITIVE — AB
Gardnerella vaginalis: NEGATIVE
Trichomonas vaginosis: NEGATIVE

## 2018-05-30 LAB — URINE CULTURE

## 2018-06-01 ENCOUNTER — Other Ambulatory Visit: Payer: Self-pay | Admitting: *Deleted

## 2018-06-01 MED ORDER — FLUCONAZOLE 150 MG PO TABS: ORAL_TABLET | ORAL | 0 refills | Status: DC

## 2018-06-02 ENCOUNTER — Encounter: Payer: Self-pay | Admitting: Genetic Counselor

## 2018-06-02 ENCOUNTER — Telehealth: Payer: Self-pay | Admitting: Genetic Counselor

## 2018-06-02 NOTE — Telephone Encounter (Signed)
New referral received for genetic counseling from Dr. Quincy Simmonds for fhx of brca. Pt has been scheduled to see Roma Kayser on 10/21 at 9am. Pt aware to arrive 15 minutes early. Letter mailed.

## 2018-06-26 ENCOUNTER — Inpatient Hospital Stay: Payer: 59

## 2018-06-26 ENCOUNTER — Inpatient Hospital Stay: Payer: 59 | Attending: Genetic Counselor | Admitting: Genetic Counselor

## 2018-06-26 ENCOUNTER — Encounter: Payer: Self-pay | Admitting: Genetic Counselor

## 2018-06-26 DIAGNOSIS — Z803 Family history of malignant neoplasm of breast: Secondary | ICD-10-CM | POA: Diagnosis not present

## 2018-06-26 DIAGNOSIS — Z800 Family history of malignant neoplasm of digestive organs: Secondary | ICD-10-CM

## 2018-06-26 DIAGNOSIS — Z806 Family history of leukemia: Secondary | ICD-10-CM

## 2018-06-26 NOTE — Progress Notes (Addendum)
REFERRING PROVIDER: Nunzio Cobbs, MD [place], [place] [address]  PRIMARY PROVIDER:  Leeroy Cha, MD  PRIMARY REASON FOR VISIT:  1. Family history of breast cancer   2. Family history of colon cancer      HISTORY OF PRESENT ILLNESS:   Monica Hansen, a 42 y.o. female, was seen for a [facility] cancer genetics consultation at the request of Dr. Yisroel Ramming due to a family history of cancer.  Monica Hansen presents to clinic today to discuss the possibility of a hereditary predisposition to cancer, genetic testing, and to further clarify her future cancer risks, as well as potential cancer risks for family members.   Monica Hansen is a 42 y.o. female with no personal history of cancer. She had a breast biopsy last year that was benign, and has had a colonoscopy in the past where fissures and hemorrhoids were found, but no polyps.    CANCER HISTORY:   No history exists.     HORMONAL RISK FACTORS:  Menarche was at age 23.  First live birth at age N/A.  OCP use for approximately 20 years.  Ovaries intact: yes.  Hysterectomy: no.  Menopausal status: premenopausal.  HRT use: 0 years. Colonoscopy: yes; normal. Mammogram within the last year: yes. Number of breast biopsies: 1. Up to date with pelvic exams:  yes. Any excessive radiation exposure in the past:  no  Past Medical History:  Diagnosis Date  . Allergy-induced asthma    prn inhaler  . Cholecystitis 05/2015  . Collar bone fracture   . Complication of anesthesia    states unable to breathe when awoke from clavicle fx. surgery - had to be given neb. tx.  . Depression   . Environmental allergies   . Family history of adverse reaction to anesthesia    states mother is hard to wake up post-op  . Family history of breast cancer   . Family history of colon cancer   . Fibroid   . Fibromyalgia   . GERD (gastroesophageal reflux disease)   . History of compression fracture of vertebral  column 02/2014   T-12  . Migraines    with aura.    Past Surgical History:  Procedure Laterality Date  . BREAST BIOPSY Right 2018   benign  . BREAST SURGERY  2018   left breast bx - benign fibroadenoma  . CHOLECYSTECTOMY N/A 05/21/2015   Procedure: LAPAROSCOPIC CHOLECYSTECTOMY WITH INTRAOPERATIVE CHOLANGIOGRAM;  Surgeon: Donnie Mesa, MD;  Location: [facility];  Service: General;  Laterality: N/A;  . LAPAROSCOPIC APPENDECTOMY  06/18/2012   Procedure: APPENDECTOMY LAPAROSCOPIC;  Surgeon: Odis Hollingshead, MD;  Location: [facility];  Service: General;  Laterality: N/A;  . ORIF CLAVICLE FRACTURE Left 2000  . PERINEAL LACERATION REPAIR  1980    Social History   Socioeconomic History  . Marital status: Married    Spouse name: Not on file  . Number of children: 0  . Years of education: Not on file  . Highest education level: Not on file  Occupational History  . Occupation: Programme researcher, broadcasting/film/video: [employer]  Social Needs  . Financial resource strain: Not on file  . Food insecurity:    Worry: Not on file    Inability: Not on file  . Transportation needs:    Medical: Not on file    Non-medical: Not on file  Tobacco Use  . Smoking status: Never Smoker  .  Smokeless tobacco: Never Used  Substance and Sexual Activity  . Alcohol use: Yes    Alcohol/week: 1.0 standard drinks    Types: 1 Glasses of wine per week    Comment: wine every 3 weeks  . Drug use: No  . Sexual activity: Yes    Partners: Male    Birth control/protection: None  Lifestyle  . Physical activity:    Days per week: Not on file    Minutes per session: Not on file  . Stress: Not on file  Relationships  . Social connections:    Talks on phone: Not on file    Gets together: Not on file    Attends religious service: Not on file    Active member of club or organization: Not on file    Attends meetings of clubs or organizations: Not on file    Relationship status: Not on file  Other  Topics Concern  . Not on file  Social History Narrative  . Not on file     FAMILY HISTORY:  We obtained a detailed, 4-generation family history.  Significant diagnoses are listed below: Family History  Problem Relation Age of Onset  . Gallbladder disease Father   . Mental illness Mother   . Hyperlipidemia Mother   . Breast cancer Mother 13  . Anesthesia problems Mother        hard to wake up post-op  . Colon cancer Maternal Grandfather        d. 55  . Leukemia Paternal Grandfather   . Alzheimer's disease Paternal Grandfather   . Brain cancer Paternal Uncle        benign brain tumor  . Congestive Heart Failure Maternal Grandmother     The patient does not have children.  She has a brother who is cancer free.  Her parents are both living.  The patient's mother had breast cancer at 77 and has a history of colon polyps.  She refuses to have genetic testing.  She has one brother who is cancer free and does not have children.  The maternal grandparents are deceased.  The grandfather died at 76 from colon cancer and the grandmother died of heart failure at 71.  The grandmother had several sisters that died of 'female issues', but the patient's mother will not discuss this.  The patient's father has had colon polyps.  He has three brothers and a sister.  One brother had a brain tumor.  The paternal grandparents are deceased.  The grandfather had leukemia and the grandmother had dementia.  [name] is unaware of previous family history of genetic testing for hereditary cancer risks. Patient's maternal ancestors are of [place] and [place] descent, and paternal ancestors are of [place] and [place] descent. There is no reported Ashkenazi Jewish ancestry. There is no known consanguinity.  GENETIC COUNSELING ASSESSMENT: Monica Hansen is a 42 y.o. female with a family history of breast and colon cancer which is somewhat suggestive of a hereditary cancer syndrome and predisposition to cancer.  We, therefore, discussed and recommended the following at today's visit.   DISCUSSION: We discussed that about 5-7% of colon cancer is due to hereditary causes, most commonly Lynch syndrome.  About 5-10% of breast cancer is hereditary, most commonly due to BRCA mutations.  We discussed that her mother's breast cancer is most likely not due to BRCA mutations due to the average age of onset, however, we have more recently determined that Lynch syndrome, the most common cause of hereditary colon  cancer, can have an increased risk for Lynch syndrome.   Her father's family has a benign brain tumor and leukemia. We discussed that most commonly, benign brain tumors are due to meningiomas, which most typically are not hereditary.  However, there are other brain tumors, such as schwannomas, that can be more commonly hereditary, yet benign.  Therefore, we could consider adding on the brain tumor genes as well.  We reviewed the characteristics, features and inheritance patterns of hereditary cancer syndromes. We also discussed genetic testing, including the appropriate family members to test, the process of testing, insurance coverage and turn-around-time for results. We discussed the implications of a negative, positive and/or variant of uncertain significant result. We recommended Ms. Mcquerry pursue genetic testing for the Multi cancer gene panel. The Multi-Gene Panel offered by [facility] includes sequencing and/or deletion duplication testing of the following 84 genes: AIP, ALK, APC, ATM, AXIN2,BAP1,  BARD1, BLM, BMPR1A, BRCA1, BRCA2, BRIP1, CASR, CDC73, CDH1, CDK4, CDKN1B, CDKN1C, CDKN2A (p14ARF), CDKN2A (p16INK4a), CEBPA, CHEK2, CTNNA1, DICER1, DIS3L2, EGFR (c.2369C>T, p.Thr790Met variant only), EPCAM (Deletion/duplication testing only), FH, FLCN, GATA2, GPC3, GREM1 (Promoter region deletion/duplication testing only), HOXB13 (c.251G>A, p.Gly84Glu), HRAS, KIT, MAX, MEN1, MET, MITF (c.952G>A, p.Glu318Lys variant only),  MLH1, MSH2, MSH3, MSH6, MUTYH, NBN, NF1, NF2, NTHL1, PALB2, PDGFRA, PHOX2B, PMS2, POLD1, POLE, POT1, PRKAR1A, PTCH1, PTEN, RAD50, RAD51C, RAD51D, RB1, RECQL4, RET, RUNX1, SDHAF2, SDHA (sequence changes only), SDHB, SDHC, SDHD, SMAD4, SMARCA4, SMARCB1, SMARCE1, STK11, SUFU, TERC, TERT, TMEM127, TP53, TSC1, TSC2, VHL, WRN and WT1.   Based on Ms. 77 family history of cancer, she meets medical criteria for genetic testing. Despite that she meets criteria, she may still have an out of pocket cost. We discussed that if her out of pocket cost for testing is over $100, the laboratory will call and confirm whether she wants to proceed with testing.  If the out of pocket cost of testing is less than $100 she will be billed by the genetic testing laboratory.   Based on the patient's personal and family history, statistical models (Harriett Rush)  and literature data were used to estimate her risk of developing breast cancer. These estimate her lifetime risk of developing breast cancer to be approximately 18.3%. This estimation does not take into account any genetic testing results.  The patient's lifetime breast cancer risk is a preliminary estimate based on available information using one of several models endorsed by the [place] (ACS). The ACS recommends consideration of breast MRI screening as an adjunct to mammography for patients at high risk (defined as 20% or greater lifetime risk). A more detailed breast cancer risk assessment can be considered, if clinically indicated.   PLAN: After considering the risks, benefits, and limitations, Ms. Heckert  provided informed consent to pursue genetic testing and the blood sample was sent to [facility] for analysis of the Multi cancer gene panel. Results should be available within approximately 2-3 weeks' time, at which point they will be disclosed by telephone to Ms. Elahi, as will any additional recommendations warranted by these results. Ms. Impastato  will receive a summary of her genetic counseling visit and a copy of her results once available. This information will also be available in Epic. We encouraged Ms. Kueker to remain in contact with cancer genetics annually so that we can continuously update the family history and inform her of any changes in cancer genetics and testing that may be of benefit for her family. Ms. Deaton questions were answered to her satisfaction today.  Our contact information was provided should additional questions or concerns arise.  Lastly, we encouraged Ms. Milholland to remain in contact with cancer genetics annually so that we can continuously update the family history and inform her of any changes in cancer genetics and testing that may be of benefit for this family.   Ms.  Kilker questions were answered to her satisfaction today. Our contact information was provided should additional questions or concerns arise. Thank you for the referral and allowing [place] to share in the care of your patient.   Karen P. Florene Glen, [place], [facility] Certified Genetic Counselor [email or web address] phone: [phone]  The patient was seen for a total of 50 minutes in face-to-face genetic counseling.  This patient was discussed with Drs. Magrinat, Lindi Adie and/or Burr Medico who agrees with the above.    _______________________________________________________________________ For Office Staff:  Number of people involved in session: 1 Was an Intern/ student involved with case: no

## 2018-06-27 ENCOUNTER — Encounter: Payer: Self-pay | Admitting: Obstetrics and Gynecology

## 2018-06-27 ENCOUNTER — Telehealth: Payer: Self-pay | Admitting: Obstetrics and Gynecology

## 2018-06-27 ENCOUNTER — Ambulatory Visit (INDEPENDENT_AMBULATORY_CARE_PROVIDER_SITE_OTHER): Payer: 59 | Admitting: Obstetrics and Gynecology

## 2018-06-27 ENCOUNTER — Other Ambulatory Visit: Payer: Self-pay

## 2018-06-27 VITALS — BP 102/74 | HR 78 | Temp 98.1°F | Resp 16 | Ht 64.0 in | Wt 157.1 lb

## 2018-06-27 DIAGNOSIS — R350 Frequency of micturition: Secondary | ICD-10-CM

## 2018-06-27 DIAGNOSIS — N760 Acute vaginitis: Secondary | ICD-10-CM | POA: Diagnosis not present

## 2018-06-27 DIAGNOSIS — R899 Unspecified abnormal finding in specimens from other organs, systems and tissues: Secondary | ICD-10-CM

## 2018-06-27 LAB — POCT URINALYSIS DIPSTICK
Bilirubin, UA: NEGATIVE
Glucose, UA: NEGATIVE
Ketones, UA: NEGATIVE
Nitrite, UA: NEGATIVE
Protein, UA: POSITIVE — AB
Urobilinogen, UA: 0.2 E.U./dL
pH, UA: 6 (ref 5.0–8.0)

## 2018-06-27 MED ORDER — NITROFURANTOIN MONOHYD MACRO 100 MG PO CAPS: 100.0000 mg | ORAL_CAPSULE | Freq: Two times a day (BID) | ORAL | 0 refills | Status: DC

## 2018-06-27 NOTE — Progress Notes (Signed)
GYNECOLOGY  VISIT   HPI: 42 y.o.   Married  Caucasian  female   G1P0000 with Patient's last menstrual period was 06/13/2018.   here for   UTI symptoms x 4 days.  Had urinary incontinence.   Having frequency voiding and blood with wiping.  Pain with urination sometimes.   No fever. Some nausea and a little back pain today.   Feels like everything is burning in the vulvar area.  Wiping a lot.   Did use one Dilfucan, and her symptoms resolved but then recurred.  Urine dip - Large WBC, Large RBC, positive protein.  GYNECOLOGIC HISTORY: Patient's last menstrual period was 06/13/2018. Contraception:  none Menopausal hormone therapy:  none Last mammogram:  05-30-18 density B/BIRADS 1 negative  Last pap smear:   05-25-17 negative, HR HPV negative         OB History    Gravida  1   Para  0   Term  0   Preterm  0   AB  0   Living  0     SAB  0   TAB  0   Ectopic  0   Multiple  0   Live Births                 Patient Active Problem List   Diagnosis Date Noted  . Family history of breast cancer   . Family history of colon cancer   . Rash and nonspecific skin eruption 07/04/2015  . Chronic left maxillary sinusitis 01/04/2015  . Allergic conjunctivitis 01/04/2015  . Vertebral fracture 02/17/2014  . Seasonal and perennial allergic rhinitis 06/30/2008  . DEPRESSION 12/22/2007  . Allergic-infective asthma 12/22/2007    Past Medical History:  Diagnosis Date  . Allergy-induced asthma    prn inhaler  . Cholecystitis 05/2015  . Collar bone fracture   . Complication of anesthesia    states unable to breathe when awoke from clavicle fx. surgery - had to be given neb. tx.  . Depression   . Environmental allergies   . Family history of adverse reaction to anesthesia    states mother is hard to wake up post-op  . Family history of breast cancer   . Family history of colon cancer   . Fibroid   . Fibromyalgia   . GERD (gastroesophageal reflux disease)   .  History of compression fracture of vertebral column 02/2014   T-12  . Migraines    with aura.    Past Surgical History:  Procedure Laterality Date  . BREAST BIOPSY Right 2018   benign  . BREAST SURGERY  2018   left breast bx - benign fibroadenoma  . CHOLECYSTECTOMY N/A 05/21/2015   Procedure: LAPAROSCOPIC CHOLECYSTECTOMY WITH INTRAOPERATIVE CHOLANGIOGRAM;  Surgeon: Donnie Mesa, MD;  Location: [facility];  Service: General;  Laterality: N/A;  . LAPAROSCOPIC APPENDECTOMY  06/18/2012   Procedure: APPENDECTOMY LAPAROSCOPIC;  Surgeon: Odis Hollingshead, MD;  Location: [facility];  Service: General;  Laterality: N/A;  . ORIF CLAVICLE FRACTURE Left 2000  . PERINEAL LACERATION REPAIR  1980    Current Outpatient Medications  Medication Sig Dispense Refill  . ADVAIR DISKUS 100-50 MCG/DOSE AEPB USE 1 PUFF EVERY 12 HOURS 60 each 1  . azelastine (ASTELIN) 0.1 % nasal spray USE 1 TO 2 PUFFS IN EACH NOSTRIL ONCE TO TWICE DAILY AS NEEDED (Patient taking differently: USE 1 PUFF IN EACH NOSTRIL ONCE TO TWICE DAILY) 30 mL 2  . buPROPion (WELLBUTRIN XL) 300  MG 24 hr tablet Take 300 mg by mouth daily.      . cetirizine (ZYRTEC) 10 MG tablet Take 10 mg by mouth daily.    Marland Kitchen dicyclomine (BENTYL) 20 MG tablet Take 20 mg by mouth 3 (three) times daily as needed for spasms.   0  . EPINEPHrine 0.3 mg/0.3 mL IJ SOAJ injection Inject 0.3 mLs (0.3 mg total) into the muscle once. 1 Device 11  . fluticasone (FLONASE) 50 MCG/ACT nasal spray USE 1 TO 2 SPRAYS IN EACH NOSTRIL ONCE DAILY AT BEDTIME (Patient taking differently: USE 1 SPRAY IN EACH NOSTRIL ONCE DAILY) 16 g 1  . levalbuterol (XOPENEX HFA) 45 MCG/ACT inhaler Inhale 2 puffs into the lungs as needed for wheezing.    . nystatin-triamcinolone (MYCOLOG II) cream Apply 1 application topically 2 (two) times daily. Apply to affected area BID for up to 7 days. 60 g 0  . pantoprazole (PROTONIX) 40 MG tablet Take 40 mg by mouth daily as needed (indigestion).    2  . QUEtiapine (SEROQUEL) 25 MG tablet Take 1 tablet by mouth at bedtime as needed (sleep).   0  . topiramate (TOPAMAX) 100 MG tablet Take 100 mg by mouth at bedtime.     Marland Kitchen zolpidem (AMBIEN) 10 MG tablet Take 1 tablet by mouth at bedtime.  1   No current facility-administered medications for this visit.      ALLERGIES: Strawberry extract; Erythromycin; Sulfonamide derivatives; Tetracyclines & related; Adhesive [tape]; Iodine; and Latex  Family History  Problem Relation Age of Onset  . Gallbladder disease Father   . Mental illness Mother   . Hyperlipidemia Mother   . Breast cancer Mother 58  . Anesthesia problems Mother        hard to wake up post-op  . Colon cancer Maternal Grandfather        d. 28  . Leukemia Paternal Grandfather   . Alzheimer's disease Paternal Grandfather   . Brain cancer Paternal Uncle        benign brain tumor  . Congestive Heart Failure Maternal Grandmother     Social History   Socioeconomic History  . Marital status: Married    Spouse name: Not on file  . Number of children: 0  . Years of education: Not on file  . Highest education level: Not on file  Occupational History  . Occupation: Programme researcher, broadcasting/film/video: [employer]  Social Needs  . Financial resource strain: Not on file  . Food insecurity:    Worry: Not on file    Inability: Not on file  . Transportation needs:    Medical: Not on file    Non-medical: Not on file  Tobacco Use  . Smoking status: Never Smoker  . Smokeless tobacco: Never Used  Substance and Sexual Activity  . Alcohol use: Yes    Alcohol/week: 1.0 standard drinks    Types: 1 Glasses of wine per week    Comment: wine every 3 weeks  . Drug use: No  . Sexual activity: Yes    Partners: Male    Birth control/protection: None  Lifestyle  . Physical activity:    Days per week: Not on file    Minutes per session: Not on file  . Stress: Not on file  Relationships  . Social connections:    Talks on phone:  Not on file    Gets together: Not on file    Attends religious service: Not on file  Active member of club or organization: Not on file    Attends meetings of clubs or organizations: Not on file    Relationship status: Not on file  . Intimate partner violence:    Fear of current or ex partner: Not on file    Emotionally abused: Not on file    Physically abused: Not on file    Forced sexual activity: Not on file  Other Topics Concern  . Not on file  Social History Narrative  . Not on file    Review of Systems  Constitutional: Positive for chills.  HENT: Negative.   Eyes: Negative.   Respiratory: Negative.   Cardiovascular: Negative.   Gastrointestinal: Negative.   Endocrine: Negative.   Genitourinary: Positive for dysuria, frequency, hematuria and urgency.       Vulvar/vaginal itching Loss of urine spontaneously  Night urination    Musculoskeletal: Negative.   Skin: Negative.   Allergic/Immunologic: Negative.   Neurological: Negative.   Hematological: Negative.   Psychiatric/Behavioral: Negative.     PHYSICAL EXAMINATION:    BP 102/74 (BP Location: Right Arm, Patient Position: Sitting, Cuff Size: Normal)   Pulse 78   Temp 98.1 F (36.7 C) (Oral)   Resp 16   Ht 5\' 4"  (1.626 m)   Wt 157 lb 1.6 oz (71.3 kg)   LMP 06/13/2018   BMI 26.97 kg/m     General appearance: alert, cooperative and appears stated age Head: Normocephalic, without obvious abnormality, atraumatic Neck: no adenopathy, supple, symmetrical, trachea midline and thyroid normal to inspection and palpation Lungs: clear to auscultation bilaterally Heart: regular rate and rhythm Abdomen: soft, non-tender, no masses,  no organomegaly Back:  Right CVA tenderness.  (States her back is usually tender from her accident.) Lymph nodes: Cervical, supraclavicular, and axillary nodes normal. No abnormal inguinal nodes palpated   Pelvic: External genitalia:  no lesions              Urethra:  normal appearing  urethra with no masses, tenderness or lesions              Bartholins and Skenes: normal                 Vagina: normal appearing vagina with normal color and discharge, no lesions              Cervix: no lesions                Bimanual Exam:  Uterus:  normal size, contour, position, consistency, mobility, non-tender              Adnexa: no mass, fullness, tenderness     Chaperone was present for exam.  ASSESSMENT  UTI.  Vulvovaginitis.  May be due to toilet tissue.   PLAN  Macrobid 100 mg po bid x 5 days.  AZO.  Urine micro and cx. Affirm.  Call if not improved in 48 hours.    An After Visit Summary was printed and given to the patient.  __15____ minutes face to face time of which over 50% was spent in counseling.

## 2018-06-27 NOTE — Telephone Encounter (Signed)
Spoke with patient and reviewed initial complaints. Reviewed with Dr. Quincy Simmonds. Okay for appointment this morning.  Encounter closed.

## 2018-06-27 NOTE — Telephone Encounter (Signed)
Patient called with painful urination, itching, burning, and some blood in her urine. She'd like an appointment with Dr. Quincy Simmonds today if possible.

## 2018-06-27 NOTE — Patient Instructions (Signed)

## 2018-06-28 LAB — VAGINITIS/VAGINOSIS, DNA PROBE
Candida Species: NEGATIVE
Gardnerella vaginalis: NEGATIVE
Trichomonas vaginosis: NEGATIVE

## 2018-06-28 LAB — URINALYSIS, MICROSCOPIC ONLY
Casts: NONE SEEN /lpf
WBC, UA: >30 /hpf — AB (ref 0–5)

## 2018-06-30 LAB — URINE CULTURE

## 2018-07-03 ENCOUNTER — Telehealth: Payer: Self-pay | Admitting: Emergency Medicine

## 2018-07-03 MED ORDER — CIPROFLOXACIN HCL 500 MG PO TABS: 500.0000 mg | ORAL_TABLET | Freq: Two times a day (BID) | ORAL | 0 refills | Status: AC

## 2018-07-03 NOTE — Telephone Encounter (Signed)
-----   Message from Nunzio Cobbs, MD sent at 07/02/2018  8:16 PM EDT ----- Please contact patient regarding her urine culture results.  I treated her with Macrobid, but the sensitivity testing indicates that this is only intermediate in treating the infection.  If she is still having any symptoms, I would recommend treating with Ciprofloxacin 500 mg po bid x 5 days.

## 2018-07-03 NOTE — Telephone Encounter (Signed)
Spoke with patient and she states she felt slightly improved intially, but still having some urinary frequency.  No fevers or flank pain.  Agreeable to treatment with Cipro and instructions given.  Will complete treatment and call back with any concerns or if symptoms do not resolve. Encounter closed.

## 2018-07-27 ENCOUNTER — Encounter: Payer: Self-pay | Admitting: Genetic Counselor

## 2018-07-27 ENCOUNTER — Telehealth: Payer: Self-pay | Admitting: Genetic Counselor

## 2018-07-27 DIAGNOSIS — Z1379 Encounter for other screening for genetic and chromosomal anomalies: Secondary | ICD-10-CM | POA: Insufficient documentation

## 2018-07-27 NOTE — Telephone Encounter (Signed)
LM on VM that results are back and to please call.  Left CB instructions. 

## 2018-08-01 NOTE — Telephone Encounter (Signed)
Revealed negative genetic testing.  Discussed that we do not know why there is cancer in the family. It could be due to a different gene that we are not testing, or maybe our current technology may not be able to pick something up.  It will be important for her to keep in contact with genetics to keep up with whether additional testing may be needed.  

## 2018-08-06 ENCOUNTER — Ambulatory Visit: Payer: Self-pay | Admitting: Genetic Counselor

## 2018-08-06 DIAGNOSIS — Z1379 Encounter for other screening for genetic and chromosomal anomalies: Secondary | ICD-10-CM

## 2018-08-06 NOTE — Progress Notes (Signed)
HPI:  Ms. Spainhower was previously seen in the [facility] clinic due to a family history of cancer and concerns regarding a hereditary predisposition to cancer. Please refer to our prior cancer genetics clinic note for more information regarding Ms. 42 medical, social and family histories, and our assessment and recommendations, at the time. Ms. Ringold recent genetic test results were disclosed to her, as were recommendations warranted by these results. These results and recommendations are discussed in more detail below.  CANCER HISTORY:   No history exists.    FAMILY HISTORY:  We obtained a detailed, 4-generation family history.  Significant diagnoses are listed below: Family History  Problem Relation Age of Onset  . Gallbladder disease Father   . Mental illness Mother   . Hyperlipidemia Mother   . Breast cancer Mother 53  . Anesthesia problems Mother        hard to wake up post-op  . Colon cancer Maternal Grandfather        d. 24  . Leukemia Paternal Grandfather   . Alzheimer's disease Paternal Grandfather   . Brain cancer Paternal Uncle        benign brain tumor  . Congestive Heart Failure Maternal Grandmother     The patient does not have children.  She has a brother who is cancer free.  Her parents are both living.  The patient's mother had breast cancer at 32 and has a history of colon polyps.  She refuses to have genetic testing.  She has one brother who is cancer free and does not have children.  The maternal grandparents are deceased.  The grandfather died at 18 from colon cancer and the grandmother died of heart failure at 36.  The grandmother had several sisters that died of 'female issues', but the patient's mother will not discuss this.  The patient's father has had colon polyps.  He has three brothers and a sister.  One brother had a brain tumor.  The paternal grandparents are deceased.  The grandfather had leukemia and the grandmother had  dementia.  Ms. Ancheta is unaware of previous family history of genetic testing for hereditary cancer risks. Patient's maternal ancestors are of [place] and [place] descent, and paternal ancestors are of [place] and [place] descent. There is no reported Ashkenazi Jewish ancestry. There is no known consanguinity.  GENETIC TEST RESULTS: Genetic testing reported out on July 26, 2018 through the multi-cancer panel found no deleterious mutations.  The Multi-Gene Panel offered by [facility] includes sequencing and/or deletion duplication testing of the following 84 genes: AIP, ALK, APC, ATM, AXIN2,BAP1,  BARD1, BLM, BMPR1A, BRCA1, BRCA2, BRIP1, CASR, CDC73, CDH1, CDK4, CDKN1B, CDKN1C, CDKN2A (p14ARF), CDKN2A (p16INK4a), CEBPA, CHEK2, CTNNA1, DICER1, DIS3L2, EGFR (c.2369C>T, p.Thr790Met variant only), EPCAM (Deletion/duplication testing only), FH, FLCN, GATA2, GPC3, GREM1 (Promoter region deletion/duplication testing only), HOXB13 (c.251G>A, p.Gly84Glu), HRAS, KIT, MAX, MEN1, MET, MITF (c.952G>A, p.Glu318Lys variant only), MLH1, MSH2, MSH3, MSH6, MUTYH, NBN, NF1, NF2, NTHL1, PALB2, PDGFRA, PHOX2B, PMS2, POLD1, POLE, POT1, PRKAR1A, PTCH1, PTEN, RAD50, RAD51C, RAD51D, RB1, RECQL4, RET, RUNX1, SDHAF2, SDHA (sequence changes only), SDHB, SDHC, SDHD, SMAD4, SMARCA4, SMARCB1, SMARCE1, STK11, SUFU, TERC, TERT, TMEM127, TP53, TSC1, TSC2, VHL, WRN and WT1.  The test report has been scanned into EPIC and is located under the Molecular Pathology section of the Results Review tab.    We discussed with Ms. Deloney that since the current genetic testing is not perfect, it is possible there may be a gene mutation in one of these  genes that current testing cannot detect, but that chance is small.  We also discussed, that it is possible that another gene that has not yet been discovered, or that we have not yet tested, is responsible for the cancer diagnoses in the family, and it is, therefore, important to remain in touch with  cancer genetics in the future so that we can continue to offer Ms. Geary the most up to date genetic testing.    CANCER SCREENING RECOMMENDATIONS: This normal result is reassuring and indicates that Ms. Bally does not likely have an increased risk of cancer due to a mutation in one of these genes.  We, therefore, recommended  Ms. Malson continue to follow the cancer screening guidelines provided by her primary healthcare providers.   An individual's cancer risk and medical management are not determined by genetic test results alone. Overall cancer risk assessment incorporates additional factors, including personal medical history, family history, and any available genetic information that may result in a personalized plan for cancer prevention and surveillance.  RECOMMENDATIONS FOR FAMILY MEMBERS:  Individuals in this family might be at some increased risk of developing cancer, over the general population risk, simply due to the family history of cancer.  We recommended women in this family have a yearly mammogram beginning at age 66, or 6 years younger than the earliest onset of cancer, an annual clinical breast exam, and perform monthly breast self-exams. Women in this family should also have a gynecological exam as recommended by their primary provider. All family members should have a colonoscopy by age 2.  FOLLOW-UP: Lastly, we discussed with Ms. Hollern that cancer genetics is a rapidly advancing field and it is possible that new genetic tests will be appropriate for her and/or her family members in the future. We encouraged her to remain in contact with cancer genetics on an annual basis so we can update her personal and family histories and let her know of advances in cancer genetics that may benefit this family.   Our contact number was provided. Ms. Luscher questions were answered to her satisfaction, and she knows she is welcome to call us at anytime with additional questions or concerns.   Roma Kayser, MS, [facility] Certified Genetic Counselor [email or web address]

## 2018-09-20 DIAGNOSIS — J1189 Influenza due to unidentified influenza virus with other manifestations: Secondary | ICD-10-CM | POA: Diagnosis not present

## 2018-09-22 DIAGNOSIS — R05 Cough: Secondary | ICD-10-CM | POA: Diagnosis not present

## 2018-09-22 DIAGNOSIS — J208 Acute bronchitis due to other specified organisms: Secondary | ICD-10-CM | POA: Diagnosis not present

## 2019-01-09 IMAGING — CT CT ABD-PELV W/ CM
2 of 5 series · 16 of 46 positions shown, 18 images · IV contrast (ISOVUE)
Comparison: CT of the abdomen and pelvis 07/05/2012

CLINICAL DATA: Abdominal pain, diverticulitis suspected.

EXAM:
CT ABDOMEN AND PELVIS WITH CONTRAST
TECHNIQUE: Multidetector CT imaging of the abdomen and pelvis was performed
using the standard protocol following bolus administration of
intravenous contrast.
CONTRAST:  100mL HVOAKZ-1VV IOPAMIDOL (HVOAKZ-1VV) INJECTION 61%

[Series 2: axial st · axial · 0.68mm/px · z∈[-373,-8]mm · 13 of 87 slices shown, 15 images]
[im 7/87  soft-tissue]
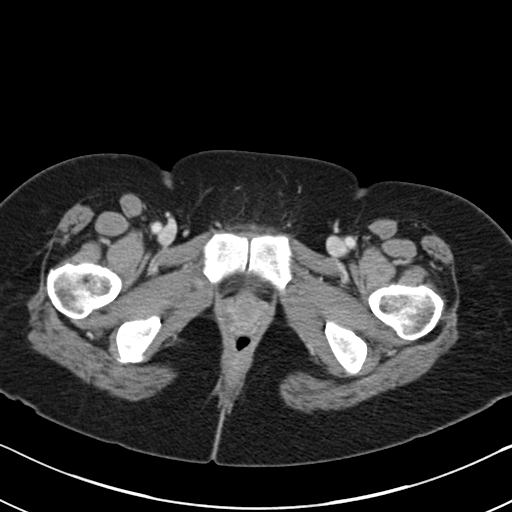
[im 7/87  bone]
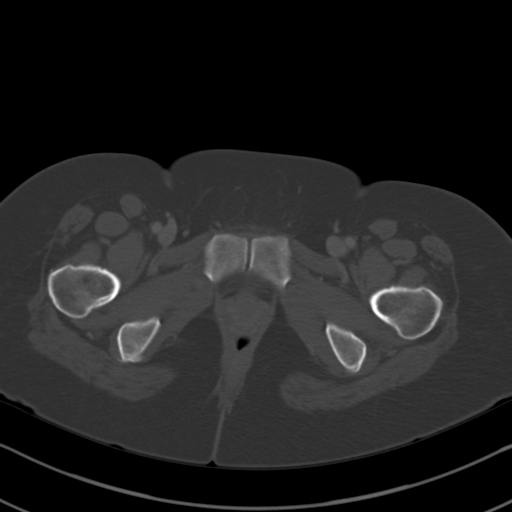
[im 13/87  soft-tissue]
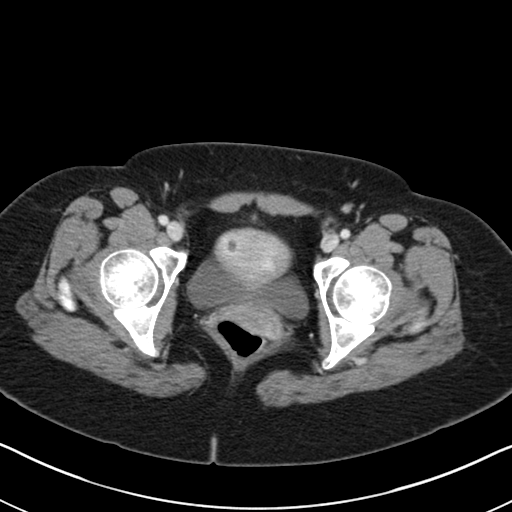
[im 19/87  soft-tissue]
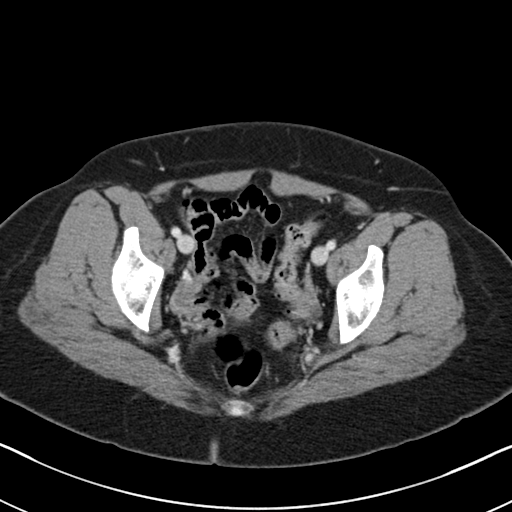
[im 25/87  soft-tissue]
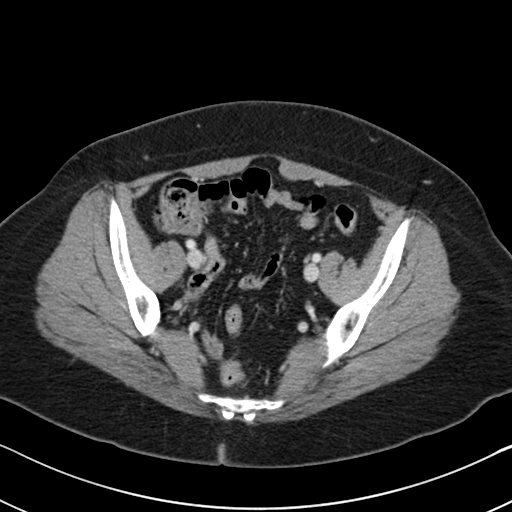
[im 31/87  soft-tissue]
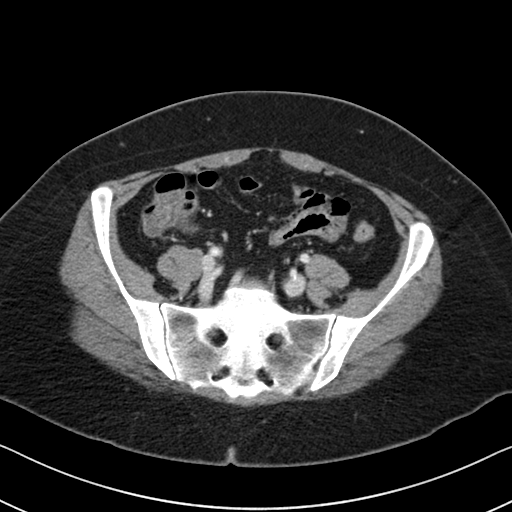
[im 37/87  soft-tissue]
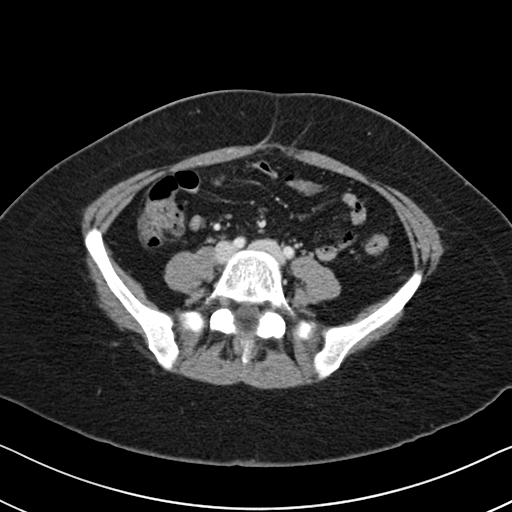
[im 44/87  soft-tissue]
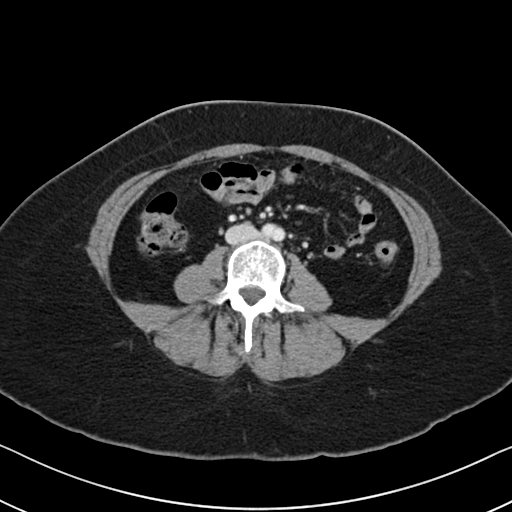
[im 50/87  soft-tissue]
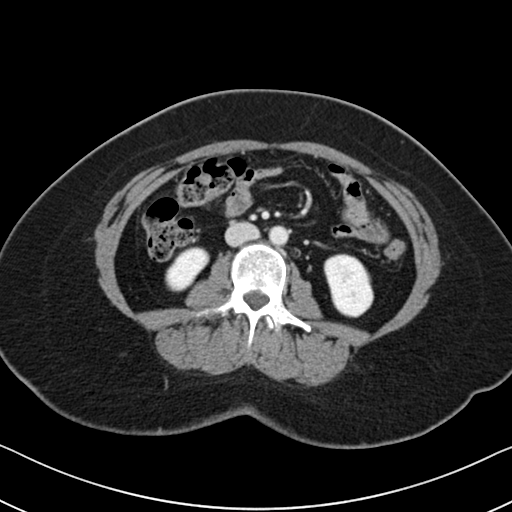
[im 56/87  soft-tissue]
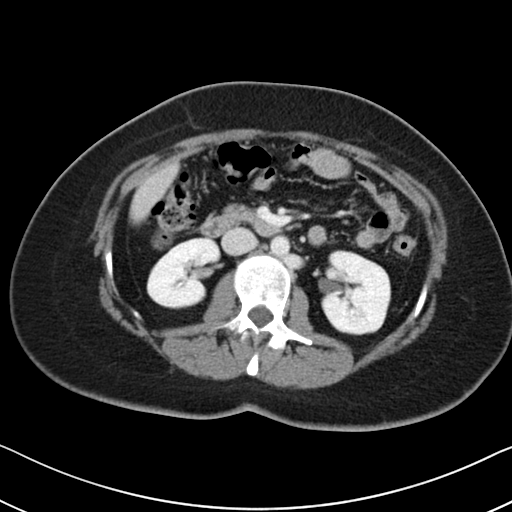
[im 56/87  bone]
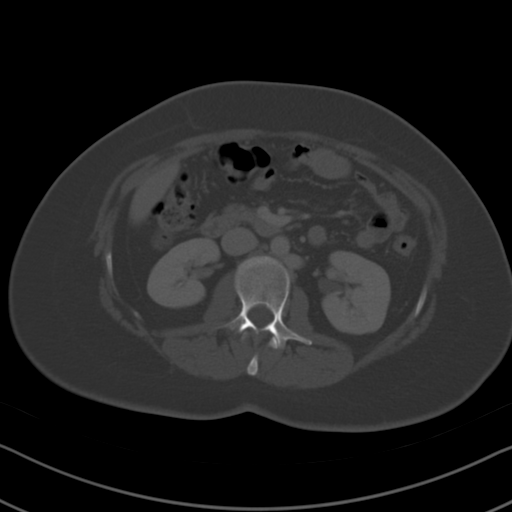
[im 62/87  soft-tissue]
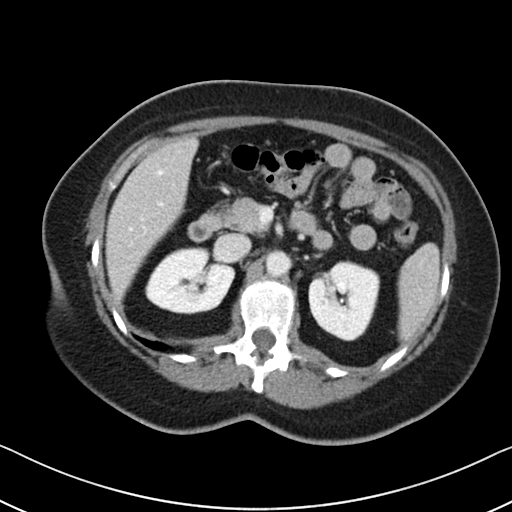
[im 68/87  soft-tissue]
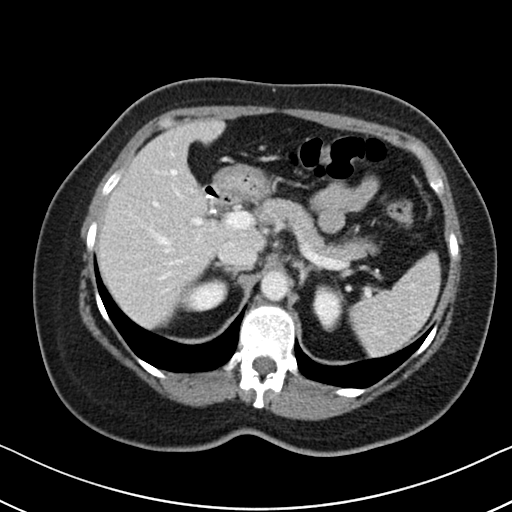
[im 74/87  soft-tissue]
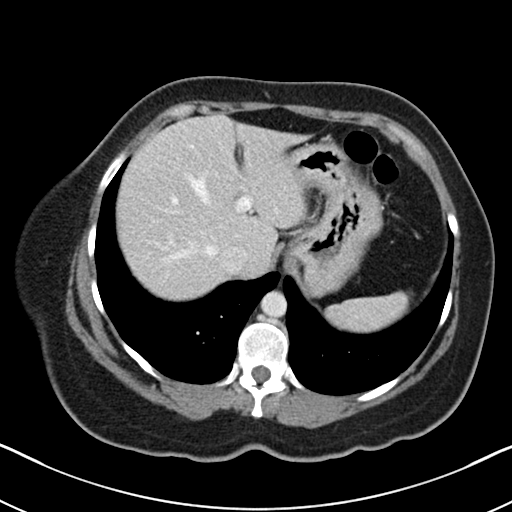
[im 80/87  soft-tissue]
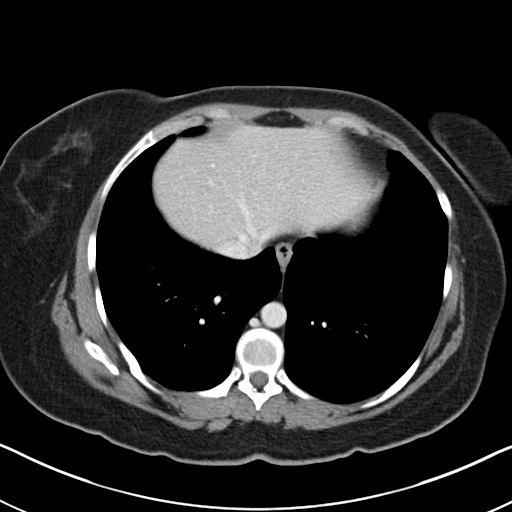

[Series 5: coronal st · coronal · 0.88mm/px · 3 of 101 slices shown]
[im 34/101  soft-tissue]
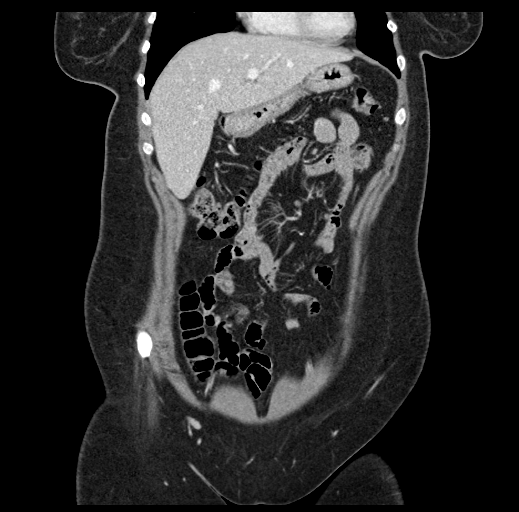
[im 45/101  soft-tissue]
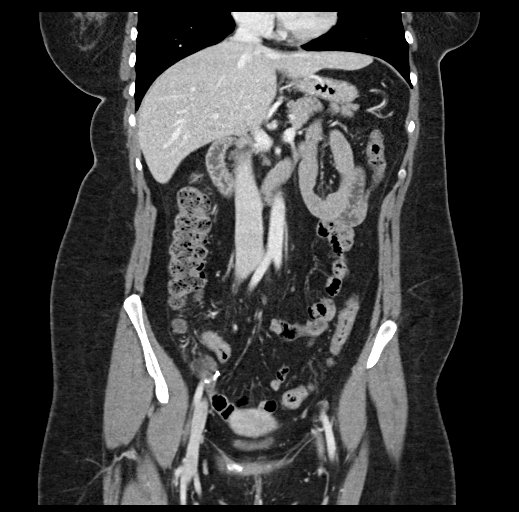
[im 56/101  soft-tissue]
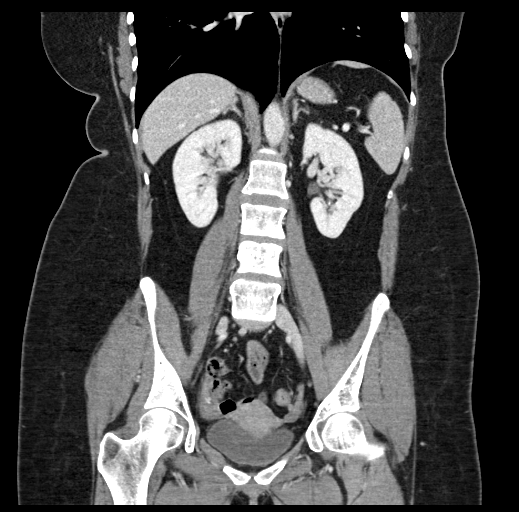

[16 of 46 positions shown; findings below may reference images not displayed]

FINDINGS: Lower chest: The lung bases are clear without focal nodule, mass, or
airspace disease. Heart size is normal. No significant pleural or
pericardial effusion is present.

Hepatobiliary: The liver is within normal limits. Cholecystectomy is
noted. The common bile duct is unremarkable.

Pancreas: Unremarkable. No pancreatic ductal dilatation or
surrounding inflammatory changes.

Spleen: Normal in size without focal abnormality.

Adrenals/Urinary Tract: The adrenal glands are normal bilaterally.
Kidneys and ureters are within normal limits bilaterally. The
urinary bladder is within normal limits.

Stomach/Bowel: The stomach and duodenum are normal. Small bowel is
unremarkable. The patient is status post appendectomy. Terminal
ileum is within normal limits. The ascending and transverse colon
are within normal limits. Descending and sigmoid colon are normal.

Vascular/Lymphatic: No significant vascular findings are present. No
enlarged abdominal or pelvic lymph nodes.

Reproductive: A 10 mm fibroid is present on the left side of the
uterine fundus. Uterus is otherwise within normal limits. A
peripherally enhancing follicle in the right ovary measures 15 mm,
likely recently collapsed follicle. The adnexa are normal
bilaterally.

Other: No abdominal wall hernia or abnormality. No abdominopelvic
ascites.

Musculoskeletal: A remote superior endplate T12 fracture is present.
Vertebral body heights alignment are otherwise normal. Bony pelvis
intact. Hips are located and within normal limits bilaterally.
IMPRESSION: 1. No acute or focal lesion to explain the patient's abdominal pain.
2. Remote T12 fracture.

## 2019-03-12 ENCOUNTER — Other Ambulatory Visit: Payer: Self-pay

## 2019-03-12 ENCOUNTER — Other Ambulatory Visit: Payer: 59

## 2019-03-12 DIAGNOSIS — Z20822 Contact with and (suspected) exposure to covid-19: Secondary | ICD-10-CM

## 2019-03-17 LAB — NOVEL CORONAVIRUS, NAA: SARS-CoV-2, NAA: NOT DETECTED

## 2019-05-29 ENCOUNTER — Other Ambulatory Visit: Payer: Self-pay | Admitting: Internal Medicine

## 2019-05-29 DIAGNOSIS — Z1231 Encounter for screening mammogram for malignant neoplasm of breast: Secondary | ICD-10-CM

## 2019-05-30 ENCOUNTER — Other Ambulatory Visit: Payer: Self-pay

## 2019-06-01 ENCOUNTER — Ambulatory Visit: Payer: Self-pay | Admitting: Obstetrics and Gynecology

## 2019-06-01 ENCOUNTER — Other Ambulatory Visit: Payer: Self-pay

## 2019-06-01 NOTE — Progress Notes (Deleted)
43 y.o. G68P0000 Married Caucasian female here for annual exam.    PCP:     No LMP recorded.           Sexually active: {yes no:314532}  The current method of family planning is none.    Exercising: {yes no:314532}  {types:19826} Smoker:  no  Health Maintenance: Pap:  05/25/17 Norm HRHPV Negative  History of abnormal Pap:  no MMG:  05/29/18 BIRADS 1 negative/density b -- scheduled 07/12/19 Colonoscopy:  n/a BMD:   n/a  Result  n/a TDaP:  January 2017 Gardasil:   no HIV: Hep C: Screening Labs:  Hb today: ***, Urine today: ***   reports that she has never smoked. She has never used smokeless tobacco. She reports current alcohol use of about 1.0 standard drinks of alcohol per week. She reports that she does not use drugs.  Past Medical History:  Diagnosis Date  . Allergy-induced asthma    prn inhaler  . Cholecystitis 05/2015  . Collar bone fracture   . Complication of anesthesia    states unable to breathe when awoke from clavicle fx. surgery - had to be given neb. tx.  . Depression   . Environmental allergies   . Family history of adverse reaction to anesthesia    states mother is hard to wake up post-op  . Family history of breast cancer   . Family history of colon cancer   . Fibroid   . Fibromyalgia   . GERD (gastroesophageal reflux disease)   . History of compression fracture of vertebral column 02/2014   T-12  . Migraines    with aura.    Past Surgical History:  Procedure Laterality Date  . BREAST BIOPSY Right 2018   benign  . BREAST SURGERY  2018   left breast bx - benign fibroadenoma  . CHOLECYSTECTOMY N/A 05/21/2015   Procedure: LAPAROSCOPIC CHOLECYSTECTOMY WITH INTRAOPERATIVE CHOLANGIOGRAM;  Surgeon: Donnie Mesa, MD;  Location: [facility];  Service: General;  Laterality: N/A;  . LAPAROSCOPIC APPENDECTOMY  06/18/2012   Procedure: APPENDECTOMY LAPAROSCOPIC;  Surgeon: Odis Hollingshead, MD;  Location: [facility];  Service: General;  Laterality: N/A;   . ORIF CLAVICLE FRACTURE Left 2000  . PERINEAL LACERATION REPAIR  1980    Current Outpatient Medications  Medication Sig Dispense Refill  . ADVAIR DISKUS 100-50 MCG/DOSE AEPB USE 1 PUFF EVERY 12 HOURS 60 each 1  . azelastine (ASTELIN) 0.1 % nasal spray USE 1 TO 2 PUFFS IN EACH NOSTRIL ONCE TO TWICE DAILY AS NEEDED (Patient taking differently: USE 1 PUFF IN EACH NOSTRIL ONCE TO TWICE DAILY) 30 mL 2  . buPROPion (WELLBUTRIN XL) 300 MG 24 hr tablet Take 300 mg by mouth daily.      . cetirizine (ZYRTEC) 10 MG tablet Take 10 mg by mouth daily.    Marland Kitchen dicyclomine (BENTYL) 20 MG tablet Take 20 mg by mouth 3 (three) times daily as needed for spasms.   0  . EPINEPHrine 0.3 mg/0.3 mL IJ SOAJ injection Inject 0.3 mLs (0.3 mg total) into the muscle once. 1 Device 11  . fluticasone (FLONASE) 50 MCG/ACT nasal spray USE 1 TO 2 SPRAYS IN EACH NOSTRIL ONCE DAILY AT BEDTIME (Patient taking differently: USE 1 SPRAY IN EACH NOSTRIL ONCE DAILY) 16 g 1  . levalbuterol (XOPENEX HFA) 45 MCG/ACT inhaler Inhale 2 puffs into the lungs as needed for wheezing.    . nitrofurantoin, macrocrystal-monohydrate, (MACROBID) 100 MG capsule Take 1 capsule (100 mg total) by mouth  2 (two) times daily. Take for 5 days. 10 capsule 0  . nystatin-triamcinolone (MYCOLOG II) cream Apply 1 application topically 2 (two) times daily. Apply to affected area BID for up to 7 days. 60 g 0  . pantoprazole (PROTONIX) 40 MG tablet Take 40 mg by mouth daily as needed (indigestion).   2  . QUEtiapine (SEROQUEL) 25 MG tablet Take 1 tablet by mouth at bedtime as needed (sleep).   0  . topiramate (TOPAMAX) 100 MG tablet Take 100 mg by mouth at bedtime.     Marland Kitchen zolpidem (AMBIEN) 10 MG tablet Take 1 tablet by mouth at bedtime.  1   No current facility-administered medications for this visit.     Family History  Problem Relation Age of Onset  . Gallbladder disease Father   . Mental illness Mother   . Hyperlipidemia Mother   . Breast cancer Mother 19   . Anesthesia problems Mother        hard to wake up post-op  . Colon cancer Maternal Grandfather        d. 70  . Leukemia Paternal Grandfather   . Alzheimer's disease Paternal Grandfather   . Brain cancer Paternal Uncle        benign brain tumor  . Congestive Heart Failure Maternal Grandmother     Review of Systems  Exam:   There were no vitals taken for this visit.    General appearance: alert, cooperative and appears stated age Head: normocephalic, without obvious abnormality, atraumatic Neck: no adenopathy, supple, symmetrical, trachea midline and thyroid normal to inspection and palpation Lungs: clear to auscultation bilaterally Breasts: normal appearance, no masses or tenderness, No nipple retraction or dimpling, No nipple discharge or bleeding, No axillary adenopathy Heart: regular rate and rhythm Abdomen: soft, non-tender; no masses, no organomegaly Extremities: extremities normal, atraumatic, no cyanosis or edema Skin: skin color, texture, turgor normal. No rashes or lesions Lymph nodes: cervical, supraclavicular, and axillary nodes normal. Neurologic: grossly normal  Pelvic: External genitalia:  no lesions              No abnormal inguinal nodes palpated.              Urethra:  normal appearing urethra with no masses, tenderness or lesions              Bartholins and Skenes: normal                 Vagina: normal appearing vagina with normal color and discharge, no lesions              Cervix: no lesions              Pap taken: {yes no:314532} Bimanual Exam:  Uterus:  normal size, contour, position, consistency, mobility, non-tender              Adnexa: no mass, fullness, tenderness              Rectal exam: {yes no:314532}.  Confirms.              Anus:  normal sphincter tone, no lesions  Chaperone was present for exam.  Assessment:   Well woman visit with normal exam.   Plan: Mammogram screening discussed. Self breast awareness reviewed. Pap and HR HPV as  above. Guidelines for Calcium, Vitamin D, regular exercise program including cardiovascular and weight bearing exercise.   Follow up annually and prn.   Additional counseling given.  {yes [id number]. _______ minutes face to  face time of which over 50% was spent in counseling.    After visit summary provided.

## 2019-07-12 ENCOUNTER — Ambulatory Visit
Admission: RE | Admit: 2019-07-12 | Discharge: 2019-07-12 | Disposition: A | Discharge status: Home / Self Care | Payer: Managed Care, Other (non HMO) | Source: Ambulatory Visit | Attending: Internal Medicine | Admitting: Internal Medicine

## 2019-07-12 ENCOUNTER — Other Ambulatory Visit: Payer: Self-pay

## 2019-07-12 DIAGNOSIS — Z1231 Encounter for screening mammogram for malignant neoplasm of breast: Secondary | ICD-10-CM

## 2019-07-16 ENCOUNTER — Other Ambulatory Visit: Payer: Self-pay | Admitting: Internal Medicine

## 2019-07-16 DIAGNOSIS — R928 Other abnormal and inconclusive findings on diagnostic imaging of breast: Secondary | ICD-10-CM

## 2019-07-18 ENCOUNTER — Ambulatory Visit
Admission: RE | Admit: 2019-07-18 | Discharge: 2019-07-18 | Disposition: A | Discharge status: Home / Self Care | Payer: Managed Care, Other (non HMO) | Source: Ambulatory Visit | Attending: Internal Medicine | Admitting: Internal Medicine

## 2019-07-18 ENCOUNTER — Other Ambulatory Visit: Payer: Self-pay

## 2019-07-18 ENCOUNTER — Other Ambulatory Visit: Payer: Self-pay | Admitting: Internal Medicine

## 2019-07-18 DIAGNOSIS — R928 Other abnormal and inconclusive findings on diagnostic imaging of breast: Secondary | ICD-10-CM

## 2019-07-18 DIAGNOSIS — N6489 Other specified disorders of breast: Secondary | ICD-10-CM

## 2019-07-26 ENCOUNTER — Other Ambulatory Visit: Payer: Self-pay

## 2019-07-30 ENCOUNTER — Encounter: Payer: Self-pay | Admitting: Obstetrics and Gynecology

## 2019-07-30 ENCOUNTER — Other Ambulatory Visit: Payer: Self-pay

## 2019-07-30 ENCOUNTER — Ambulatory Visit (INDEPENDENT_AMBULATORY_CARE_PROVIDER_SITE_OTHER): Payer: Managed Care, Other (non HMO) | Admitting: Obstetrics and Gynecology

## 2019-07-30 VITALS — BP 122/78 | HR 76 | Temp 97.2°F | Resp 16 | Ht 63.5 in | Wt 158.6 lb

## 2019-07-30 DIAGNOSIS — Z01419 Encounter for gynecological examination (general) (routine) without abnormal findings: Secondary | ICD-10-CM | POA: Diagnosis not present

## 2019-07-30 NOTE — Progress Notes (Signed)
43 y.o. G47P0000 Married Caucasian female here for annual exam.    Monthly menses, heavier flow and stronger cramping.  Cannot take NSAIDs, so she takes Tylenol.  She does not want to take any medication which can upset her digestive issues. She does not have a gall bladder.  She has IBS issues.   Had respiratory issues this summer.  Not Covid.   Started a new job in [place], [place].  She is a Government social research officer.   PCP: Leeroy Cha, MD    Patient's last menstrual period was 07/12/2019 (exact date).           Sexually active: Yes.    The current method of family planning is condoms everytime.    Exercising: Yes.    works on a farm. Smoker:  no  Health Maintenance: Pap:  05/25/17 Neg:Neg HR HPV, 02-02-13 Neg:Neg HR HPV, 01-20-10 Neg  History of abnormal Pap:  no MMG:  07/18/19 Right Breast Diagnostic MM/US - BIRADS 3:Probably Benign/density b.  Due for a recheck in May, 2021. Colonoscopy:  10/27/17 -- See Care Everywhere Digestive health.  Due in 5 years per patient? BMD:   n/a  Result  n/a TDaP:  2017 Gardasil:   no HIV:no Hep C:no Screening Labs:  PCP.  Flu vaccine:  Completed.   reports that she has never smoked. She has never used smokeless tobacco. She reports previous alcohol use. She reports that she does not use drugs.  Past Medical History:  Diagnosis Date  . Allergy-induced asthma    prn inhaler  . Cholecystitis 05/2015  . Collar bone fracture   . Complication of anesthesia    states unable to breathe when awoke from clavicle fx. surgery - had to be given neb. tx.  . Depression   . Environmental allergies   . Family history of adverse reaction to anesthesia    states mother is hard to wake up post-op  . Family history of breast cancer   . Family history of colon cancer   . Fibroid   . Fibromyalgia   . GERD (gastroesophageal reflux disease)   . History of compression fracture of vertebral column 02/2014   T-12  . Migraines    with aura.    Past  Surgical History:  Procedure Laterality Date  . BREAST BIOPSY Right 2018   benign  . BREAST SURGERY  2018   left breast bx - benign fibroadenoma  . CHOLECYSTECTOMY N/A 05/21/2015   Procedure: LAPAROSCOPIC CHOLECYSTECTOMY WITH INTRAOPERATIVE CHOLANGIOGRAM;  Surgeon: Donnie Mesa, MD;  Location: [facility];  Service: General;  Laterality: N/A;  . LAPAROSCOPIC APPENDECTOMY  06/18/2012   Procedure: APPENDECTOMY LAPAROSCOPIC;  Surgeon: Odis Hollingshead, MD;  [facility];  Service: General;  Laterality: N/A;  . ORIF CLAVICLE FRACTURE Left 2000  . PERINEAL LACERATION REPAIR  1980    Current Outpatient Medications  Medication Sig Dispense Refill  . ADVAIR DISKUS 100-50 MCG/DOSE AEPB USE 1 PUFF EVERY 12 HOURS 60 each 1  . azelastine (ASTELIN) 0.1 % nasal spray USE 1 TO 2 PUFFS IN EACH NOSTRIL ONCE TO TWICE DAILY AS NEEDED (Patient taking differently: USE 1 PUFF IN EACH NOSTRIL ONCE TO TWICE DAILY) 30 mL 2  . buPROPion (WELLBUTRIN XL) 300 MG 24 hr tablet Take 300 mg by mouth daily.      . cetirizine (ZYRTEC) 10 MG tablet Take 10 mg by mouth daily.    . colestipol (COLESTID) 1 g tablet Take 1 tablet by mouth 2 (two)  times daily.    Marland Kitchen dicyclomine (BENTYL) 20 MG tablet Take 20 mg by mouth 3 (three) times daily as needed for spasms.   0  . EPINEPHrine 0.3 mg/0.3 mL IJ SOAJ injection Inject 0.3 mLs (0.3 mg total) into the muscle once. 1 Device 11  . fluticasone (FLONASE) 50 MCG/ACT nasal spray USE 1 TO 2 SPRAYS IN EACH NOSTRIL ONCE DAILY AT BEDTIME (Patient taking differently: USE 1 SPRAY IN EACH NOSTRIL ONCE DAILY) 16 g 1  . levalbuterol (XOPENEX HFA) 45 MCG/ACT inhaler Inhale 2 puffs into the lungs as needed for wheezing.    Marland Kitchen levocetirizine (XYZAL) 5 MG tablet Take 1 tablet by mouth daily.    . montelukast (SINGULAIR) 10 MG tablet Take 10 mg by mouth daily.    Marland Kitchen nystatin-triamcinolone (MYCOLOG II) cream Apply 1 application topically 2 (two) times daily. Apply to affected area BID  for up to 7 days. 60 g 0  . ondansetron (ZOFRAN) 4 MG tablet as needed.    . pantoprazole (PROTONIX) 40 MG tablet Take 40 mg by mouth daily as needed (indigestion).   2  . QUEtiapine (SEROQUEL) 25 MG tablet Take 1 tablet by mouth at bedtime as needed (sleep).   0  . SUMAtriptan (IMITREX) 50 MG tablet Take 1 tablet by mouth as needed.    . topiramate (TOPAMAX) 100 MG tablet Take 100 mg by mouth at bedtime.      No current facility-administered medications for this visit.     Family History  Problem Relation Age of Onset  . Gallbladder disease Father   . Mental illness Mother   . Hyperlipidemia Mother   . Breast cancer Mother 65  . Anesthesia problems Mother        hard to wake up post-op  . Colon cancer Maternal Grandfather        d. 36  . Leukemia Paternal Grandfather   . Alzheimer's disease Paternal Grandfather   . Brain cancer Paternal Uncle        benign brain tumor  . Congestive Heart Failure Maternal Grandmother     Review of Systems  All other systems reviewed and are negative.   Exam:   BP 122/78   Pulse 76   Temp (!) 97.2 F (36.2 C) (Temporal)   Resp 16   Ht 5' 3.5" (1.613 m)   Wt 158 lb 9.6 oz (71.9 kg)   LMP 07/12/2019 (Exact Date)   BMI 27.65 kg/m     General appearance: alert, cooperative and appears stated age Head: normocephalic, without obvious abnormality, atraumatic Neck: no adenopathy, supple, symmetrical, trachea midline and thyroid normal to inspection and palpation Lungs: clear to auscultation bilaterally Breasts: normal appearance, no masses or tenderness, No nipple retraction or dimpling, No nipple discharge or bleeding, No axillary adenopathy Heart: regular rate and rhythm Abdomen: soft, non-tender; no masses, no organomegaly Extremities: extremities normal, atraumatic, no cyanosis or edema Skin: skin color, texture, turgor normal. No rashes or lesions Lymph nodes: cervical, supraclavicular, and axillary nodes normal. Neurologic: grossly  normal  Pelvic: External genitalia:  no lesions              No abnormal inguinal nodes palpated.              Urethra:  normal appearing urethra with no masses, tenderness or lesions              Bartholins and Skenes: normal  Vagina: normal appearing vagina with normal color and discharge, no lesions              Cervix: no lesions              Pap taken: No. Bimanual Exam:  Uterus:  normal size, contour, position, consistency, mobility, non-tender              Adnexa: no mass, fullness, tenderness              Rectal exam: Yes.  .  Confirms.              Anus:  normal sphincter tone, no lesions  Chaperone was present for exam.  Assessment:   Well woman visit with normal exam. Migraine with aura.  Mother with breast cancer age 41.   Uncertain family history.   Negative genetic testing.  Status post left breast biopsy - fibroadenoma.  Hx fibroid.  Plan: Mammogram screening discussed.  She is due for a dx check in May 2021.  Self breast awareness reviewed. Pap and HR HPV in 2023.   New guidelines discussed. Guidelines for Calcium, Vitamin D, regular exercise program including cardiovascular and weight bearing exercise. She declines any tx for improving her menstrual cycles. Follow up annually and prn.   After visit summary provided.

## 2019-07-30 NOTE — Patient Instructions (Signed)

## 2019-10-03 ENCOUNTER — Ambulatory Visit: Payer: Managed Care, Other (non HMO) | Attending: Internal Medicine

## 2019-10-03 ENCOUNTER — Other Ambulatory Visit: Payer: Self-pay

## 2019-10-03 DIAGNOSIS — Z20822 Contact with and (suspected) exposure to covid-19: Secondary | ICD-10-CM

## 2019-10-04 LAB — NOVEL CORONAVIRUS, NAA: SARS-CoV-2, NAA: NOT DETECTED

## 2019-10-15 ENCOUNTER — Ambulatory Visit
Admission: RE | Admit: 2019-10-15 | Discharge: 2019-10-15 | Disposition: A | Discharge status: Home / Self Care | Payer: Managed Care, Other (non HMO) | Source: Ambulatory Visit | Attending: Allergy | Admitting: Allergy

## 2019-10-15 ENCOUNTER — Other Ambulatory Visit: Payer: Self-pay | Admitting: Allergy

## 2019-10-15 DIAGNOSIS — J4530 Mild persistent asthma, uncomplicated: Secondary | ICD-10-CM

## 2019-10-18 ENCOUNTER — Encounter (HOSPITAL_COMMUNITY): Payer: Self-pay | Admitting: Emergency Medicine

## 2019-10-18 ENCOUNTER — Other Ambulatory Visit: Payer: Self-pay

## 2019-10-18 ENCOUNTER — Emergency Department (HOSPITAL_COMMUNITY)
Admission: EM | Admit: 2019-10-18 | Discharge: 2019-10-19 | Disposition: A | Discharge status: Home / Self Care | Payer: Managed Care, Other (non HMO) | Attending: Emergency Medicine | Admitting: Emergency Medicine

## 2019-10-18 DIAGNOSIS — Z79899 Other long term (current) drug therapy: Secondary | ICD-10-CM | POA: Diagnosis not present

## 2019-10-18 DIAGNOSIS — Z91040 Latex allergy status: Secondary | ICD-10-CM | POA: Insufficient documentation

## 2019-10-18 DIAGNOSIS — G43809 Other migraine, not intractable, without status migrainosus: Secondary | ICD-10-CM | POA: Diagnosis not present

## 2019-10-18 DIAGNOSIS — H538 Other visual disturbances: Secondary | ICD-10-CM | POA: Diagnosis present

## 2019-10-18 NOTE — ED Notes (Addendum)
Patient states "I can see. Things just go out of focus on me and I cant see all of a sudden. And there is a lot of pressure behind the eyes."

## 2019-10-18 NOTE — ED Triage Notes (Signed)
Patient reports bilateral eyes blurred vision onset last month more at right side , no vision loss , patient added left cheek redness this week , denies injury . No neuro deficits .

## 2019-10-19 ENCOUNTER — Encounter (HOSPITAL_COMMUNITY): Payer: Self-pay | Admitting: Emergency Medicine

## 2019-10-19 ENCOUNTER — Emergency Department (HOSPITAL_COMMUNITY): Payer: Managed Care, Other (non HMO)

## 2019-10-19 LAB — I-STAT CHEM 8, ED
BUN: 7 mg/dL (ref 6–20)
Calcium, Ion: 1.12 mmol/L — ABNORMAL LOW (ref 1.15–1.40)
Chloride: 109 mmol/L (ref 98–111)
Creatinine, Ser: 0.5 mg/dL (ref 0.44–1.00)
Glucose, Bld: 134 mg/dL — ABNORMAL HIGH (ref 70–99)
HCT: 41 % (ref 36.0–46.0)
Hemoglobin: 13.9 g/dL (ref 12.0–15.0)
Potassium: 3.5 mmol/L (ref 3.5–5.1)
Sodium: 142 mmol/L (ref 135–145)
TCO2: 21 mmol/L — ABNORMAL LOW (ref 22–32)

## 2019-10-19 LAB — I-STAT BETA HCG BLOOD, ED (MC, WL, AP ONLY): I-stat hCG, quantitative: <5 m[IU]/mL (ref <5)

## 2019-10-19 MED ORDER — GADOBUTROL 1 MMOL/ML IV SOLN
6.5000 mL | Freq: Once | INTRAVENOUS | Status: AC | PRN
  Administered 2019-10-19: 6.5 mL via INTRAVENOUS

## 2019-10-19 MED ORDER — KETOROLAC TROMETHAMINE 15 MG/ML IJ SOLN
15.0000 mg | Freq: Once | INTRAMUSCULAR | Status: AC
  Administered 2019-10-19: 15 mg via INTRAVENOUS
  Filled 2019-10-19: qty 1

## 2019-10-19 MED ORDER — MAGNESIUM SULFATE 2 GM/50ML IV SOLN
2.0000 g | Freq: Once | INTRAVENOUS | Status: AC
  Administered 2019-10-19: 01:00:00 2 g via INTRAVENOUS
  Filled 2019-10-19: qty 50

## 2019-10-19 NOTE — ED Provider Notes (Signed)
[facility] EMERGENCY DEPARTMENT Provider Note   CSN: [id number] Arrival date & time: 10/18/19  1941     History Chief Complaint  Patient presents with  . Blurred Vision/ Left face is red    NANCEE KARAHALIOS is a 44 y.o. female.  The history is provided by the patient.  Eye Problem Location:  Both eyes Quality: pressure. Severity:  Moderate Onset quality:  Gradual Timing:  Constant Progression:  Worsening Chronicity:  New Context: not burn, not chemical exposure and not contact lens problem   Context comment:  Had a sinus infection and was treated for same Relieved by: augmentin now on steroids. Worsened by:  Nothing Ineffective treatments:  None tried Associated symptoms: blurred vision   Associated symptoms: no crusting, no decreased vision, no discharge, no double vision, no facial rash, no headaches, no inflammation, no itching, no nausea, no numbness, no redness, no scotomas, no swelling, no tearing, no tingling, no vomiting and no weakness   Risk factors: no conjunctival hemorrhage   Patient also reports redness to the left malar eminence.  No proptosis.  No f/c/r.  No confusion.  No changes in speech.  No weakness no numbness.  Has not seen optho.       Past Medical History:  Diagnosis Date  . Allergy-induced asthma    prn inhaler  . Cholecystitis 05/2015  . Collar bone fracture   . Complication of anesthesia    states unable to breathe when awoke from clavicle fx. surgery - had to be given neb. tx.  . Depression   . Environmental allergies   . Family history of adverse reaction to anesthesia    states mother is hard to wake up post-op  . Family history of breast cancer   . Family history of colon cancer   . Fibroid   . Fibromyalgia   . GERD (gastroesophageal reflux disease)   . History of compression fracture of vertebral column 02/2014   T-12  . Migraines    with aura.    Patient Active Problem List   Diagnosis Date Noted  .  Genetic testing 07/27/2018  . Family history of breast cancer   . Family history of colon cancer   . Rash and nonspecific skin eruption 07/04/2015  . Chronic left maxillary sinusitis 01/04/2015  . Allergic conjunctivitis 01/04/2015  . Vertebral fracture 02/17/2014  . Seasonal and perennial allergic rhinitis 06/30/2008  . DEPRESSION 12/22/2007  . Allergic-infective asthma 12/22/2007    Past Surgical History:  Procedure Laterality Date  . BREAST BIOPSY Right 2018   benign  . BREAST SURGERY  2018   left breast bx - benign fibroadenoma  . CHOLECYSTECTOMY N/A 05/21/2015   Procedure: LAPAROSCOPIC CHOLECYSTECTOMY WITH INTRAOPERATIVE CHOLANGIOGRAM;  Surgeon: Donnie Mesa, MD;  Location: [facility];  Service: General;  Laterality: N/A;  . LAPAROSCOPIC APPENDECTOMY  06/18/2012   Procedure: APPENDECTOMY LAPAROSCOPIC;  Surgeon: Odis Hollingshead, MD;  Location: [facility];  Service: General;  Laterality: N/A;  . ORIF CLAVICLE FRACTURE Left 2000  . PERINEAL LACERATION REPAIR  1980     OB History    Gravida  1   Para  0   Term  0   Preterm  0   AB  0   Living  0     SAB  0   TAB  0   Ectopic  0   Multiple  0   Live Births  Family History  Problem Relation Age of Onset  . Gallbladder disease Father   . Mental illness Mother   . Hyperlipidemia Mother   . Breast cancer Mother 106  . Anesthesia problems Mother        hard to wake up post-op  . Colon cancer Maternal Grandfather        d. 65  . Leukemia Paternal Grandfather   . Alzheimer's disease Paternal Grandfather   . Brain cancer Paternal Uncle        benign brain tumor  . Congestive Heart Failure Maternal Grandmother     Social History   Tobacco Use  . Smoking status: Never Smoker  . Smokeless tobacco: Never Used  Substance Use Topics  . Alcohol use: Not Currently    Alcohol/week: 0.0 standard drinks    Comment: rarely  . Drug use: No    Home Medications Prior to Admission  medications   Medication Sig Start Date End Date Taking? Authorizing Provider  ADVAIR DISKUS 100-50 MCG/DOSE AEPB USE 1 PUFF EVERY 12 HOURS 06/02/15   Young, Clinton D, MD  azelastine (ASTELIN) 0.1 % nasal spray USE 1 TO 2 PUFFS IN EACH NOSTRIL ONCE TO TWICE DAILY AS NEEDED Patient taking differently: USE 1 PUFF IN EACH NOSTRIL ONCE TO TWICE DAILY 03/14/17   Baird Lyons D, MD  buPROPion (WELLBUTRIN XL) 300 MG 24 hr tablet Take 300 mg by mouth daily.      [provider]  cetirizine (ZYRTEC) 10 MG tablet Take 10 mg by mouth daily.    [provider]  colestipol (COLESTID) 1 g tablet Take 1 tablet by mouth 2 (two) times daily. 05/17/18   [provider]  dicyclomine (BENTYL) 20 MG tablet Take 20 mg by mouth 3 (three) times daily as needed for spasms.  08/10/17   [provider]  EPINEPHrine 0.3 mg/0.3 mL IJ SOAJ injection Inject 0.3 mLs (0.3 mg total) into the muscle once. 07/03/15   Baird Lyons D, MD  fluticasone (FLONASE) 50 MCG/ACT nasal spray USE 1 TO 2 SPRAYS IN EACH NOSTRIL ONCE DAILY AT BEDTIME Patient taking differently: USE 1 SPRAY IN EACH NOSTRIL ONCE DAILY 08/09/16   Deneise Lever, MD  levalbuterol [facility] HFA) 45 MCG/ACT inhaler Inhale 2 puffs into the lungs as needed for wheezing.    [provider]  levocetirizine (XYZAL) 5 MG tablet Take 1 tablet by mouth daily. 05/23/18   [provider]  montelukast (SINGULAIR) 10 MG tablet Take 10 mg by mouth daily. 03/02/19   [provider]  nystatin-triamcinolone (MYCOLOG II) cream Apply 1 application topically 2 (two) times daily. Apply to affected area BID for up to 7 days. 05/25/17   Nunzio Cobbs, MD  ondansetron (ZOFRAN) 4 MG tablet as needed. 11/14/14   [provider]  pantoprazole (PROTONIX) 40 MG tablet Take 40 mg by mouth daily as needed (indigestion).  05/17/17   [provider]  QUEtiapine (SEROQUEL) 25 MG tablet Take 1 tablet by mouth at  bedtime as needed (sleep).  04/24/17   [provider]  SUMAtriptan (IMITREX) 50 MG tablet Take 1 tablet by mouth as needed.    [provider]  topiramate (TOPAMAX) 100 MG tablet Take 100 mg by mouth at bedtime.     [provider]    Allergies    Strawberry extract, Erythromycin, Sulfonamide derivatives, Tetracyclines & related, Adhesive [tape], Iodine, and Latex  Review of Systems   Review of Systems  Constitutional:  Negative for fatigue and fever.  HENT: Positive for sinus pressure. Negative for dental problem, sore throat, trouble swallowing and voice change.   Eyes: Positive for blurred vision. Negative for double vision, discharge, redness and itching.  Respiratory: Negative for cough and shortness of breath.   Cardiovascular: Negative for chest pain.  Gastrointestinal: Negative for nausea and vomiting.  Genitourinary: Negative for difficulty urinating.  Musculoskeletal: Negative for arthralgias.  Neurological: Negative for dizziness, tingling, tremors, seizures, syncope, facial asymmetry, speech difficulty, weakness, numbness and headaches.  Psychiatric/Behavioral: Negative for agitation and confusion.  All other systems reviewed and are negative.   Physical Exam Updated Vital Signs BP (!) 140/93 (BP Location: Right Arm)   Pulse 79   Temp 98 F (36.7 C) (Oral)   Resp 18   Ht 5\' 4"  (1.626 m)   Wt 67.1 kg   LMP 10/04/2019   SpO2 100%   BMI 25.40 kg/m   Physical Exam Vitals and nursing note reviewed.  Constitutional:      General: She is not in acute distress.    Appearance: Normal appearance.  HENT:     Head: Normocephalic and atraumatic.     Comments: No temporal bulging nor tenderness, no erythema no warmth    Nose: Nose normal.     Mouth/Throat:     Mouth: Mucous membranes are moist.     Pharynx: Oropharynx is clear.  Eyes:     Extraocular Movements: Extraocular movements intact.     Conjunctiva/sclera: Conjunctivae normal.      Pupils: Pupils are equal, round, and reactive to light.     Comments: No proptosis intact EOM, intact cognitiion  Cardiovascular:     Rate and Rhythm: Normal rate and regular rhythm.     Pulses: Normal pulses.     Heart sounds: Normal heart sounds.  Pulmonary:     Effort: Pulmonary effort is normal.     Breath sounds: Normal breath sounds.  Abdominal:     General: Abdomen is flat. Bowel sounds are normal.     Tenderness: There is no abdominal tenderness. There is no guarding.  Musculoskeletal:        General: Normal range of motion.     Cervical back: Normal range of motion and neck supple.  Skin:    General: Skin is warm and dry.     Capillary Refill: Capillary refill takes less than 2 seconds.  Neurological:     General: No focal deficit present.     Mental Status: She is alert and oriented to person, place, and time.     Cranial Nerves: No cranial nerve deficit.     Deep Tendon Reflexes: Reflexes normal.  Psychiatric:        Mood and Affect: Mood normal.        Behavior: Behavior normal.     ED Results / Procedures / Treatments   Labs (all labs ordered are listed, but only abnormal results are displayed) Results for orders placed or performed during the hospital encounter of 10/18/19  I-Stat Beta hCG blood, ED ([facility], [facility], [facility] only)  Result Value Ref Range   I-stat hCG, quantitative <5.0 <5 mIU/mL   Comment 3          I-stat chem 8, ED (not at [facility] or [facility])  Result Value Ref Range   Sodium 142 135 - 145 mmol/L   Potassium 3.5 3.5 - 5.1 mmol/L   Chloride 109 98 - 111 mmol/L   BUN 7 6 - 20 mg/dL  Creatinine, Ser 0.50 0.44 - 1.00 mg/dL   Glucose, Bld 134 (H) 70 - 99 mg/dL   Calcium, Ion 1.12 (L) 1.15 - 1.40 mmol/L   TCO2 21 (L) 22 - 32 mmol/L   Hemoglobin 13.9 12.0 - 15.0 g/dL   HCT 41.0 36.0 - 46.0 %   DG Chest 2 View  Result Date: 10/15/2019 CLINICAL DATA:  Shortness of breath. EXAM: CHEST - 2 VIEW COMPARISON:  August 18, 2016. FINDINGS: The heart size and  mediastinal contours are within normal limits. Both lungs are clear. No pneumothorax or pleural effusion is noted. The visualized skeletal structures are unremarkable. IMPRESSION: No active cardiopulmonary disease. Electronically Signed   By: Marijo Conception M.D.   On: 10/15/2019 15:51    Results for orders placed or performed during the hospital encounter of 10/18/19  I-Stat Beta hCG blood, ED ([facility], [facility], [facility] only)  Result Value Ref Range   I-stat hCG, quantitative <5.0 <5 mIU/mL   Comment 3          I-stat chem 8, ED (not at [facility] or [facility])  Result Value Ref Range   Sodium 142 135 - 145 mmol/L   Potassium 3.5 3.5 - 5.1 mmol/L   Chloride 109 98 - 111 mmol/L   BUN 7 6 - 20 mg/dL   Creatinine, Ser 0.50 0.44 - 1.00 mg/dL   Glucose, Bld 134 (H) 70 - 99 mg/dL   Calcium, Ion 1.12 (L) 1.15 - 1.40 mmol/L   TCO2 21 (L) 22 - 32 mmol/L   Hemoglobin 13.9 12.0 - 15.0 g/dL   HCT 41.0 36.0 - 46.0 %   DG Chest 2 View  Result Date: 10/15/2019 CLINICAL DATA:  Shortness of breath. EXAM: CHEST - 2 VIEW COMPARISON:  August 18, 2016. FINDINGS: The heart size and mediastinal contours are within normal limits. Both lungs are clear. No pneumothorax or pleural effusion is noted. The visualized skeletal structures are unremarkable. IMPRESSION: No active cardiopulmonary disease. Electronically Signed   By: Marijo Conception M.D.   On: 10/15/2019 15:51   MR BRAIN WO CONTRAST  Result Date: 10/19/2019 CLINICAL DATA:  Blurred vision over the last month. EXAM: MRI HEAD WITHOUT CONTRAST TECHNIQUE: Multiplanar, multiecho pulse sequences of the brain and surrounding structures were obtained without intravenous contrast. COMPARISON:  None. FINDINGS: Brain: The brain has a normal appearance without evidence of malformation, atrophy, old or acute small or large vessel infarction, mass lesion, hemorrhage, hydrocephalus or extra-axial collection. Vascular: Major vessels at the base of the brain show flow. Venous sinuses appear patent.  Skull and upper cervical spine: Normal. Sinuses/Orbits: Clear/normal. Other: None significant. IMPRESSION: Normal examination. Electronically Signed   By: Nelson Chimes M.D.   On: 10/19/2019 03:27   MR MRV HEAD W WO CONTRAST  Result Date: 10/19/2019 CLINICAL DATA:  Blurred vision over the last month. EXAM: MR VENOGRAM HEAD WITHOUT AND WITH CONTRAST TECHNIQUE: Angiographic images of the intracranial venous structures were obtained using MRV technique without and with intravenous contrast. CONTRAST:  6.5 cc Gadavist COMPARISON:  Brain imaging same day FINDINGS: Deep venous system is normal. Superior sagittal sinus is normal. Transverse and sigmoid sinuses are normal with the right being dominant. IMPRESSION: Normal venous evaluation. Electronically Signed   By: Nelson Chimes M.D.   On: 10/19/2019 03:29    Radiology No results found.  Procedures Procedures (including critical care time)  Medications Ordered in ED Medications - No data to display  ED Course  I have reviewed the triage vital  signs and the nursing notes.  Pertinent labs & imaging results that were available during my care of the patient were reviewed by me and considered in my medical decision making (see chart for details).    Visual Acuity  Right Eye Distance: 20/32 Left Eye Distance: 20/25 Bilateral Distance:    Right Eye Near:   Left Eye Near:    Bilateral Near:      Case d/w Dr. Rory Percy, this is not temporal arteritis.  MRI and MRV, if negative likely migraine.     Vision is intact.  Will have patient follow up with ophthalmology.  No cavernous sinus thrombosis.  Orbits and sinuses are normal.  Patient is stable for discharge with close follow up.  This is likely migraines.  But I would like patient to follow up with her eye specialist Dr. Gershon Crane.  There are no signs of skin infection on my exam and I do not believe the patient needs antibiotics at this time.  There is no swelling of the face on my exam either.  Follow  up with your PMD and Dr. Gershon Crane for ongoing care.  ALEXIYA JOVIC was evaluated in Emergency Department on 10/19/2019 for the symptoms described in the history of present illness. She was evaluated in the context of the global COVID-19 pandemic, which necessitated consideration that the patient might be at risk for infection with the SARS-CoV-2 virus that causes COVID-19. Institutional protocols and algorithms that pertain to the evaluation of patients at risk for COVID-19 are in a state of rapid change based on information released by regulatory bodies including the CDC and federal and state organizations. These policies and algorithms were followed during the patient's care in the ED.  Final Clinical Impression(s) / ED Diagnoses Return for weakness, numbness, changes in vision or speech, fevers >100.4 unrelieved by medication, shortness of breath, intractable vomiting, or diarrhea, abdominal pain, Inability to tolerate liquids or food, cough, altered mental status or any concerns. No signs of systemic illness or infection. The patient is nontoxic-appearing on exam and vital signs are within normal limits.   I have reviewed the triage vital signs and the nursing notes. Pertinent labs &imaging results that were available during my care of the patient were reviewed by me and considered in my medical decision making (see chart for details).  After history, exam, and medical workup I feel the patient has been appropriately medically screened and is safe for discharge home. Pertinent diagnoses were discussed with the patient. Patient was given return precautions   Shresta Risden, MD 10/19/19 [id number]

## 2019-10-19 NOTE — ED Notes (Signed)
Pt transported to MRI 

## 2019-11-10 ENCOUNTER — Ambulatory Visit: Payer: Managed Care, Other (non HMO) | Attending: Internal Medicine

## 2019-11-10 ENCOUNTER — Other Ambulatory Visit: Payer: Self-pay

## 2019-11-10 DIAGNOSIS — Z23 Encounter for immunization: Secondary | ICD-10-CM | POA: Insufficient documentation

## 2019-11-10 NOTE — Progress Notes (Signed)
   Covid-19 Vaccination Clinic  Name:  Monica Hansen    MRN: [id number] DOB: 04/09/1976  11/10/2019  Monica Hansen was observed post Covid-19 immunization for 15 minutes without incident. She was provided with Vaccine Information Sheet and instruction to access the V-Safe system.   Monica Hansen was instructed to call 911 with any severe reactions post vaccine: Marland Kitchen Difficulty breathing  . Swelling of face and throat  . A fast heartbeat  . A bad rash all over body  . Dizziness and weakness   Immunizations Administered    Name Date Dose VIS Date Route   Moderna COVID-19 Vaccine 11/10/2019  9:48 AM 0.5 mL 08/07/2019 Intramuscular   Manufacturer: Moderna   Lot: [id number]   [id number]

## 2019-12-12 ENCOUNTER — Ambulatory Visit: Payer: Managed Care, Other (non HMO) | Attending: Internal Medicine

## 2019-12-12 DIAGNOSIS — Z23 Encounter for immunization: Secondary | ICD-10-CM

## 2019-12-12 NOTE — Progress Notes (Signed)
   Covid-19 Vaccination Clinic  Name:  Monica Hansen    MRN: [id number] DOB: 1976-03-15  12/12/2019  [name] was observed post Covid-19 immunization for 30 minutes based on pre-vaccination screening without incident. She was provided with Vaccine Information Sheet and instruction to access the V-Safe system.   [name] was instructed to call 911 with any severe reactions post vaccine: Marland Kitchen Difficulty breathing  . Swelling of face and throat  . A fast heartbeat  . A bad rash all over body  . Dizziness and weakness   Immunizations Administered    Name Date Dose VIS Date Route   Moderna COVID-19 Vaccine 12/12/2019 10:02 AM 0.5 mL 08/07/2019 Intramuscular   Manufacturer: Levan Hurst   LotFY:[id number]   [id number]

## 2020-01-16 ENCOUNTER — Other Ambulatory Visit: Payer: Self-pay | Admitting: Internal Medicine

## 2020-01-16 ENCOUNTER — Other Ambulatory Visit: Payer: Self-pay

## 2020-01-16 ENCOUNTER — Ambulatory Visit
Admission: RE | Admit: 2020-01-16 | Discharge: 2020-01-16 | Disposition: A | Discharge status: Home / Self Care | Payer: Managed Care, Other (non HMO) | Source: Ambulatory Visit | Attending: Internal Medicine | Admitting: Internal Medicine

## 2020-01-16 DIAGNOSIS — N6489 Other specified disorders of breast: Secondary | ICD-10-CM

## 2020-07-22 ENCOUNTER — Ambulatory Visit
Admission: RE | Admit: 2020-07-22 | Discharge: 2020-07-22 | Disposition: A | Discharge status: Home / Self Care | Payer: Managed Care, Other (non HMO) | Source: Ambulatory Visit | Attending: Internal Medicine | Admitting: Internal Medicine

## 2020-07-22 ENCOUNTER — Other Ambulatory Visit: Payer: Self-pay

## 2020-07-22 DIAGNOSIS — N6489 Other specified disorders of breast: Secondary | ICD-10-CM

## 2020-09-19 ENCOUNTER — Ambulatory Visit: Payer: Managed Care, Other (non HMO) | Admitting: Obstetrics and Gynecology

## 2020-09-26 ENCOUNTER — Other Ambulatory Visit: Payer: Managed Care, Other (non HMO)

## 2020-09-26 ENCOUNTER — Other Ambulatory Visit: Payer: Self-pay

## 2020-09-26 DIAGNOSIS — Z8616 Personal history of COVID-19: Secondary | ICD-10-CM

## 2020-09-26 DIAGNOSIS — Z20822 Contact with and (suspected) exposure to covid-19: Secondary | ICD-10-CM

## 2020-09-26 HISTORY — DX: Personal history of COVID-19: Z86.16

## 2020-09-28 LAB — NOVEL CORONAVIRUS, NAA: SARS-CoV-2, NAA: DETECTED — AB

## 2020-09-28 LAB — SPECIMEN STATUS REPORT

## 2020-09-28 LAB — SARS-COV-2, NAA 2 DAY TAT

## 2020-10-08 ENCOUNTER — Other Ambulatory Visit: Payer: Self-pay

## 2020-10-08 ENCOUNTER — Encounter (HOSPITAL_COMMUNITY): Payer: Self-pay | Admitting: *Deleted

## 2020-10-08 ENCOUNTER — Emergency Department (HOSPITAL_COMMUNITY)
Admission: EM | Admit: 2020-10-08 | Discharge: 2020-10-09 | Disposition: A | Discharge status: Home / Self Care | Payer: Managed Care, Other (non HMO) | Attending: Emergency Medicine | Admitting: Emergency Medicine

## 2020-10-08 DIAGNOSIS — U071 COVID-19: Secondary | ICD-10-CM | POA: Diagnosis not present

## 2020-10-08 DIAGNOSIS — R0602 Shortness of breath: Secondary | ICD-10-CM | POA: Diagnosis present

## 2020-10-08 DIAGNOSIS — Z91040 Latex allergy status: Secondary | ICD-10-CM | POA: Diagnosis not present

## 2020-10-08 DIAGNOSIS — J4541 Moderate persistent asthma with (acute) exacerbation: Secondary | ICD-10-CM | POA: Diagnosis not present

## 2020-10-08 NOTE — ED Triage Notes (Addendum)
The pt is c/o having asyhma and difficulty breathing  For 2-3 days  She was pos for covid jan  21she called her doctor earlier today that told her she may have pneumonia  He did not see her.  Med was called in  Earlier today

## 2020-10-09 ENCOUNTER — Emergency Department (HOSPITAL_COMMUNITY): Payer: Managed Care, Other (non HMO)

## 2020-10-09 LAB — COMPREHENSIVE METABOLIC PANEL
ALT: 23 U/L (ref 0–44)
AST: 32 U/L (ref 15–41)
Albumin: 3.8 g/dL (ref 3.5–5.0)
Alkaline Phosphatase: 102 U/L (ref 38–126)
Anion gap: 12 (ref 5–15)
BUN: 8 mg/dL (ref 6–20)
CO2: 19 mmol/L — ABNORMAL LOW (ref 22–32)
Calcium: 9.4 mg/dL (ref 8.9–10.3)
Chloride: 108 mmol/L (ref 98–111)
Creatinine, Ser: 0.69 mg/dL (ref 0.44–1.00)
GFR, Estimated: >60 mL/min (ref >60)
Glucose, Bld: 128 mg/dL — ABNORMAL HIGH (ref 70–99)
Potassium: 4.2 mmol/L (ref 3.5–5.1)
Sodium: 139 mmol/L (ref 135–145)
Total Bilirubin: 0.7 mg/dL (ref 0.3–1.2)
Total Protein: 7.1 g/dL (ref 6.5–8.1)

## 2020-10-09 LAB — CBC WITH DIFFERENTIAL/PLATELET
Abs Immature Granulocytes: 0.16 10*3/uL — ABNORMAL HIGH (ref 0.00–0.07)
Basophils Absolute: 0 10*3/uL (ref 0.0–0.1)
Basophils Relative: 0 %
Eosinophils Absolute: 0 10*3/uL (ref 0.0–0.5)
Eosinophils Relative: 0 %
HCT: 43.2 % (ref 36.0–46.0)
Hemoglobin: 13.9 g/dL (ref 12.0–15.0)
Immature Granulocytes: 1 %
Lymphocytes Relative: 14 %
Lymphs Abs: 2.1 10*3/uL (ref 0.7–4.0)
MCH: 29 pg (ref 26.0–34.0)
MCHC: 32.2 g/dL (ref 30.0–36.0)
MCV: 90 fL (ref 80.0–100.0)
Monocytes Absolute: 0.7 10*3/uL (ref 0.1–1.0)
Monocytes Relative: 5 %
Neutro Abs: 11.9 10*3/uL — ABNORMAL HIGH (ref 1.7–7.7)
Neutrophils Relative %: 80 %
Platelets: 361 10*3/uL (ref 150–400)
RBC: 4.8 MIL/uL (ref 3.87–5.11)
RDW: 12.3 % (ref 11.5–15.5)
WBC: 14.9 10*3/uL — ABNORMAL HIGH (ref 4.0–10.5)
nRBC: 0 % (ref 0.0–0.2)

## 2020-10-09 LAB — URINALYSIS, ROUTINE W REFLEX MICROSCOPIC
Bacteria, UA: NONE SEEN
Bilirubin Urine: NEGATIVE
Glucose, UA: NEGATIVE mg/dL
Ketones, ur: NEGATIVE mg/dL
Leukocytes,Ua: NEGATIVE
Nitrite: NEGATIVE
Protein, ur: NEGATIVE mg/dL
Specific Gravity, Urine: 1.015 (ref 1.005–1.030)
pH: 6 (ref 5.0–8.0)

## 2020-10-09 LAB — LACTIC ACID, PLASMA
Lactic Acid, Venous: 3.3 mmol/L (ref 0.5–1.9)
Lactic Acid, Venous: 1.7 mmol/L (ref 0.5–1.9)

## 2020-10-09 MED ORDER — SODIUM CHLORIDE 0.9 % IV BOLUS
1000.0000 mL | Freq: Once | INTRAVENOUS | Status: AC
  Administered 2020-10-09: 1000 mL via INTRAVENOUS

## 2020-10-09 MED ORDER — MAGNESIUM SULFATE 2 GM/50ML IV SOLN
2.0000 g | Freq: Once | INTRAVENOUS | Status: AC
  Administered 2020-10-09: 2 g via INTRAVENOUS
  Filled 2020-10-09: qty 50

## 2020-10-09 MED ORDER — AEROCHAMBER PLUS FLO-VU LARGE MISC
1.0000 | Freq: Once | Status: AC
  Administered 2020-10-09: 1

## 2020-10-09 MED ORDER — ALBUTEROL SULFATE HFA 108 (90 BASE) MCG/ACT IN AERS
8.0000 | INHALATION_SPRAY | Freq: Once | RESPIRATORY_TRACT | Status: AC
  Administered 2020-10-09: 8 via RESPIRATORY_TRACT
  Filled 2020-10-09: qty 6.7

## 2020-10-09 MED ORDER — IPRATROPIUM BROMIDE HFA 17 MCG/ACT IN AERS
2.0000 | INHALATION_SPRAY | Freq: Once | RESPIRATORY_TRACT | Status: AC
  Administered 2020-10-09: 2 via RESPIRATORY_TRACT
  Filled 2020-10-09: qty 12.9

## 2020-10-09 NOTE — ED Provider Notes (Signed)
[facility] EMERGENCY DEPARTMENT Provider Note  CSN: [id number] Arrival date & time: 10/08/20 2324  Chief Complaint(s) Asthma and Shortness of Breath  HPI Monica Hansen is a 45 y.o. female with a past medical history listed below including asthma who tested positive for COVID-19 on January 21 with a home antigen test. Monica Hansen reports that Monica Hansen has been having URI symptoms since January 20. Monica Hansen presents today for several days of gradually worsening shortness of breath and dry cough not relieved with her home albuterol/rescue inhalers. Shortness of breath is worsened with cough and activity. Alleviated mildly by rest and initially with the albuterol inhalers but over the past day her inhalers have had less efficacy. Monica Hansen called into her PCP who prescribed her Augmentin and prednisone. Monica Hansen took the first dose this and reports that her symptoms worsened after that. Monica Hansen denies any associated chest pain. No nausea or vomiting. No abdominal pain. No other physical complaints.  HPI  Past Medical History Past Medical History:  Diagnosis Date  . Allergy-induced asthma    prn inhaler  . Cholecystitis 05/2015  . Collar bone fracture   . Complication of anesthesia    states unable to breathe when awoke from clavicle fx. surgery - had to be given neb. tx.  . Depression   . Environmental allergies   . Family history of adverse reaction to anesthesia    states mother is hard to wake up post-op  . Family history of breast cancer   . Family history of colon cancer   . Fibroid   . Fibromyalgia   . GERD (gastroesophageal reflux disease)   . History of compression fracture of vertebral column 02/2014   T-12  . Migraines    with aura.   Patient Active Problem List   Diagnosis Date Noted  . Genetic testing 07/27/2018  . Family history of breast cancer   . Family history of colon cancer   . Rash and nonspecific skin eruption 07/04/2015  . Chronic left maxillary sinusitis  01/04/2015  . Allergic conjunctivitis 01/04/2015  . Vertebral fracture 02/17/2014  . Seasonal and perennial allergic rhinitis 06/30/2008  . DEPRESSION 12/22/2007  . Allergic-infective asthma 12/22/2007   Home Medication(s) Prior to Admission medications   Medication Sig Start Date End Date Taking? Authorizing Provider  ADVAIR DISKUS 100-50 MCG/DOSE AEPB USE 1 PUFF EVERY 12 HOURS Patient taking differently: Inhale 1 puff into the lungs in the morning and at bedtime. 06/02/15  Yes Baird Lyons D, MD  amoxicillin-clavulanate (AUGMENTIN) 875-125 MG tablet Take 1 tablet by mouth See admin instructions. Bid x 10 days 10/08/20  Yes [provider]  azelastine (ASTELIN) 0.1 % nasal spray USE 1 TO 2 PUFFS IN EACH NOSTRIL ONCE TO TWICE DAILY AS NEEDED 03/14/17  Yes Young, Clinton D, MD  benzonatate (TESSALON) 200 MG capsule Take 200 mg by mouth 3 (three) times daily as needed for cough. 10/02/20  Yes [provider]  buPROPion (WELLBUTRIN XL) 300 MG 24 hr tablet Take 300 mg by mouth daily.   Yes [provider]  cetirizine (ZYRTEC) 10 MG tablet Take 10 mg by mouth every other day.   Yes [provider]  Cholecalciferol-Vitamin C (VITAMIN D3-VITAMIN C PO) Take 1 tablet by mouth daily.   Yes [provider]  colestipol (COLESTID) 1 g tablet Take 1 tablet by mouth 2 (two) times daily. 05/17/18  Yes [provider]  dicyclomine (BENTYL) 20 MG tablet Take 20 mg by mouth 3 (three) times  daily as needed for spasms.  08/10/17  Yes [provider]  diphenhydrAMINE (SOMINEX) 25 MG tablet Take 25 mg by mouth at bedtime as needed for allergies.   Yes [provider]  EPINEPHrine 0.3 mg/0.3 mL IJ SOAJ injection Inject 0.3 mLs (0.3 mg total) into the muscle once. 07/03/15  Yes Young, Tarri Fuller D, MD  fexofenadine (ALLEGRA) 180 MG tablet Take 180 mg by mouth every other day.   Yes [provider]  fluticasone (FLONASE) 50 MCG/ACT nasal spray USE  1 TO 2 SPRAYS IN EACH NOSTRIL ONCE DAILY AT BEDTIME Patient taking differently: Place 1-2 sprays into both nostrils at bedtime. 08/09/16  Yes Young, Tarri Fuller D, MD  guaiFENesin (MUCINEX) 600 MG 12 hr tablet Take 600 mg by mouth 2 (two) times daily as needed for cough.   Yes [provider]  levalbuterol (XOPENEX HFA) 45 MCG/ACT inhaler Inhale 2 puffs into the lungs as needed for wheezing.   Yes [provider]  levocetirizine (XYZAL) 5 MG tablet Take 1 tablet by mouth daily. 05/23/18  Yes [provider]  montelukast (SINGULAIR) 10 MG tablet Take 10 mg by mouth daily. 03/02/19  Yes [provider]  Multiple Vitamins-Minerals (ONE-A-DAY WOMENS PO) Take 1 tablet by mouth daily.   Yes [provider]  ondansetron (ZOFRAN) 4 MG tablet Take 4 mg by mouth every 8 (eight) hours as needed for nausea or vomiting. 11/14/14  Yes [provider]  pantoprazole (PROTONIX) 40 MG tablet Take 40 mg by mouth every other day. 05/17/17  Yes [provider]  predniSONE (DELTASONE) 50 MG tablet Take 50 mg by mouth See admin instructions. Qd x 5 days 10/08/20  Yes [provider]  QUEtiapine (SEROQUEL) 25 MG tablet Take 1 tablet by mouth at bedtime as needed (sleep).  04/24/17  Yes [provider]  zonisamide (ZONEGRAN) 25 MG capsule Take 50 mg by mouth at bedtime. 11/15/19  Yes [provider]                                                                                                                                    Past Surgical History Past Surgical History:  Procedure Laterality Date  . BREAST BIOPSY Right 2018   benign  . BREAST SURGERY  2018   left breast bx - benign fibroadenoma  . CHOLECYSTECTOMY N/A 05/21/2015   Procedure: LAPAROSCOPIC CHOLECYSTECTOMY WITH INTRAOPERATIVE CHOLANGIOGRAM;  Surgeon: Donnie Mesa, MD;  Location: [facility];  Service: General;  Laterality: N/A;  . LAPAROSCOPIC APPENDECTOMY   06/18/2012   Procedure: APPENDECTOMY LAPAROSCOPIC;  Surgeon: Odis Hollingshead, MD;  Location: [facility];  Service: General;  Laterality: N/A;  . ORIF CLAVICLE FRACTURE Left 2000  . PERINEAL LACERATION REPAIR  1980   Family History Family History  Problem Relation Age of Onset  . Gallbladder disease Father   . Mental illness Mother   . Hyperlipidemia Mother   .  Breast cancer Mother 49  . Anesthesia problems Mother        hard to wake up post-op  . Colon cancer Maternal Grandfather        d. 52  . Leukemia Paternal Grandfather   . Alzheimer's disease Paternal Grandfather   . Brain cancer Paternal Uncle        benign brain tumor  . Congestive Heart Failure Maternal Grandmother     Social History Social History   Tobacco Use  . Smoking status: Never Smoker  . Smokeless tobacco: Never Used  Vaping Use  . Vaping Use: Never used  Substance Use Topics  . Alcohol use: Not Currently    Alcohol/week: 0.0 standard drinks    Comment: rarely  . Drug use: No   Allergies Strawberry extract, Erythromycin, Sulfonamide derivatives, Tetracyclines & related, Adhesive [tape], Iodine, and Latex  Review of Systems Review of Systems All other systems are reviewed and are negative for acute change except as noted in the HPI  Physical Exam Vital Signs  I have reviewed the triage vital signs BP (!) 122/99 (BP Location: Right Arm)   Pulse 93   Temp 98.1 F (36.7 C) (Oral)   Resp 18   Ht 5\' 4"  (1.626 m)   Wt 67.1 kg   LMP 10/08/2020   SpO2 98%   BMI 25.39 kg/m   Physical Exam Vitals reviewed.  Constitutional:      General: Monica Hansen is not in acute distress.    Appearance: Monica Hansen is well-developed and well-nourished. Monica Hansen is not diaphoretic.  HENT:     Head: Normocephalic and atraumatic.     Nose: Nose normal.  Eyes:     General: No scleral icterus.       Right eye: No discharge.        Left eye: No discharge.     Extraocular Movements: EOM normal.     Conjunctiva/sclera: Conjunctivae  normal.     Pupils: Pupils are equal, round, and reactive to light.  Cardiovascular:     Rate and Rhythm: Normal rate and regular rhythm.     Heart sounds: No murmur heard. No friction rub. No gallop.   Pulmonary:     Effort: Pulmonary effort is normal. Tachypnea present. No respiratory distress.     Breath sounds: No stridor. Wheezing (faint exp) present. No rales.     Comments: Can speak in full sentences, but does get winded. Abdominal:     General: There is no distension.     Palpations: Abdomen is soft.     Tenderness: There is no abdominal tenderness.  Musculoskeletal:        General: No tenderness or edema.     Cervical back: Normal range of motion and neck supple.  Skin:    General: Skin is warm and dry.     Findings: No erythema or rash.  Neurological:     Mental Status: Monica Hansen is alert and oriented to person, place, and time.  Psychiatric:        Mood and Affect: Mood and affect normal.     ED Results and Treatments Labs (all labs ordered are listed, but only abnormal results are displayed) Labs Reviewed  COMPREHENSIVE METABOLIC PANEL - Abnormal; Notable for the following components:      Result Value   CO2 19 (*)    Glucose, Bld 128 (*)    All other components within normal limits  LACTIC ACID, PLASMA - Abnormal; Notable for the following components:   Lactic  Acid, Venous 3.3 (*)    All other components within normal limits  URINALYSIS, ROUTINE W REFLEX MICROSCOPIC - Abnormal; Notable for the following components:   Hgb urine dipstick SMALL (*)    All other components within normal limits  CBC WITH DIFFERENTIAL/PLATELET - Abnormal; Notable for the following components:   WBC 14.9 (*)    Neutro Abs 11.9 (*)    Abs Immature Granulocytes 0.16 (*)    All other components within normal limits  CULTURE, BLOOD (ROUTINE X 2)  CULTURE, BLOOD (ROUTINE X 2)  LACTIC ACID, PLASMA  CBC WITH DIFFERENTIAL/PLATELET  CBC WITH DIFFERENTIAL/PLATELET  I-STAT BETA HCG BLOOD, ED  ([facility], [facility], [facility] ONLY)                                                                                                                         EKG  EKG Interpretation  Date/Time:  Wednesday October 08 2020 23:55:19 EST Ventricular Rate:  111 PR Interval:  118 QRS Duration: 82 QT Interval:  330 QTC Calculation: 448 R Axis:   54 Text Interpretation: Sinus tachycardia Otherwise normal ECG No old tracing to compare Confirmed by Addison Lank [phone]) on 10/09/2020 2:03:49 AM      Radiology DG Chest Portable 1 View  Result Date: 10/09/2020 CLINICAL DATA:  COVID-19 positive 09/26/2020, asthma EXAM: PORTABLE CHEST 1 VIEW COMPARISON:  08/18/2016 FINDINGS: No consolidation, features of edema, pneumothorax, or effusion. Pulmonary vascularity is normally distributed. The cardiomediastinal contours are unremarkable. No acute osseous or soft tissue abnormality. IMPRESSION: No acute cardiopulmonary abnormality. Electronically Signed   By: Lovena Le M.D.   On: 10/09/2020 00:20    Pertinent labs & imaging results that were available during my care of the patient were reviewed by me and considered in my medical decision making (see chart for details).  Medications Ordered in ED Medications  albuterol (VENTOLIN HFA) 108 (90 Base) MCG/ACT inhaler 8 puff (8 puffs Inhalation Given 10/09/20 0300)  ipratropium (ATROVENT HFA) inhaler 2 puff (2 puffs Inhalation Given 10/09/20 0300)  AeroChamber Plus Flo-Vu Large MISC 1 each (1 each Other Given 10/09/20 0301)  magnesium sulfate IVPB 2 g 50 mL (0 g Intravenous Stopped 10/09/20 0428)  sodium chloride 0.9 % bolus 1,000 mL (0 mLs Intravenous Stopped 10/09/20 0429)  Procedures .1-3 Lead EKG Interpretation Performed by: Fatima Blank, MD Authorized by: Fatima Blank, MD     Interpretation: normal     ECG rate:  92   ECG rate  assessment: normal     Rhythm: sinus rhythm     Ectopy: none     Conduction: normal   .Critical Care Performed by: Fatima Blank, MD Authorized by: Fatima Blank, MD   Critical care provider statement:    Critical care time (minutes):  45   Critical care was necessary to treat or prevent imminent or life-threatening deterioration of the following conditions:  Respiratory failure   Critical care was time spent personally by me on the following activities:  Discussions with consultants, evaluation of patient's response to treatment, examination of patient, ordering and performing treatments and interventions, ordering and review of laboratory studies, ordering and review of radiographic studies, pulse oximetry, re-evaluation of patient's condition, obtaining history from patient or surrogate and review of old charts    (including critical care time)  Medical Decision Making / ED Course I have reviewed the nursing notes for this encounter and the patient's prior records (if available in EHR or on provided paperwork).   ARIBEL CHAUSSEE was evaluated in Emergency Department on 10/09/2020 for the symptoms described in the history of present illness. Monica Hansen was evaluated in the context of the global COVID-19 pandemic, which necessitated consideration that the patient might be at risk for infection with the SARS-CoV-2 virus that causes COVID-19. Institutional protocols and algorithms that pertain to the evaluation of patients at risk for COVID-19 are in a state of rapid change based on information released by regulatory bodies including the CDC and federal and state organizations. These policies and algorithms were followed during the patient's care in the ED.   Clinical Course as of 10/09/20 0542  Thu Oct 09, 2020  0235 Asthma exacerbation in the setting of recent COVID-19 infection. Patient is satting well on room air that Monica Hansen does have mildly worsened increased work of  breathing. Chest x-ray without evidence of pneumonia. Labs drawn in triage notable for leukocytosis likely from steroid. Metabolic panel without significant electrolyte derangements or renal sufficiency. Patient's initial lactic acid elevated at 3.3.  This may be related to her increased albuterol use and tachycardia. Will provide patient with IV fluids and repeat the lactic acid.  We will treat patient with high-dose albuterol/Atrovent puffs through a spacer and also give IV magnesium.  We will monitor the patient closely and reassess [PC]  0420 SOB and WOB improved with treatment. Finishing up IVF. Will repeat lactic. [PC]  0537 Lactic acid normalized.  Still feels improved breathing. [PC]    Clinical Course User Index [PC] Keari Miu, Grayce Sessions, MD     Final Clinical Impression(s) / ED Diagnoses Final diagnoses:  COVID-19 virus infection  Moderate persistent asthma with exacerbation   The patient appears reasonably screened and/or stabilized for discharge and I doubt any other medical condition or other [facility] requiring further screening, evaluation, or treatment in the ED at this time prior to discharge. Safe for discharge with strict return precautions.  Disposition: Discharge  Condition: Good  I have discussed the results, Dx and Tx plan with the patient/family who expressed understanding and agree(s) with the plan. Discharge instructions discussed at length. The patient/family was given strict return precautions who verbalized understanding of the instructions. No further questions at time of discharge.    ED Discharge Orders    None  Follow Up: Leeroy Cha, MD [address] [place] [address] [phone]  Call  To schedule an appointment for close follow up, in 3-5 days, If symptoms do not improve or  worsen     This chart was dictated using voice recognition software.  Despite best efforts to proofread,  errors can occur which  can change the documentation meaning.   Fatima Blank, MD 10/09/20 [phone]

## 2020-10-14 LAB — CULTURE, BLOOD (ROUTINE X 2)
Culture: NO GROWTH
Culture: NO GROWTH
Special Requests: ADEQUATE
Special Requests: ADEQUATE

## 2020-10-15 ENCOUNTER — Other Ambulatory Visit: Payer: Self-pay

## 2020-10-15 ENCOUNTER — Encounter: Payer: Self-pay | Admitting: Obstetrics and Gynecology

## 2020-10-15 ENCOUNTER — Ambulatory Visit (INDEPENDENT_AMBULATORY_CARE_PROVIDER_SITE_OTHER): Payer: Managed Care, Other (non HMO) | Admitting: Obstetrics and Gynecology

## 2020-10-15 VITALS — BP 118/80 | HR 97 | Ht 63.5 in | Wt 151.0 lb

## 2020-10-15 DIAGNOSIS — Z3009 Encounter for other general counseling and advice on contraception: Secondary | ICD-10-CM | POA: Diagnosis not present

## 2020-10-15 DIAGNOSIS — Z01419 Encounter for gynecological examination (general) (routine) without abnormal findings: Secondary | ICD-10-CM | POA: Diagnosis not present

## 2020-10-15 NOTE — Patient Instructions (Signed)
EXERCISE AND DIET:  We recommended that you start or continue a regular exercise program for good health. Regular exercise means any activity that makes your heart beat faster and makes you sweat.  We recommend exercising at least 30 minutes per day at least 3 days a week, preferably 4 or 5.  We also recommend a diet low in fat and sugar.  Inactivity, poor dietary choices and obesity can cause diabetes, heart attack, stroke, and kidney damage, among others.    ALCOHOL AND SMOKING:  Women should limit their alcohol intake to no more than 7 drinks/beers/glasses of wine (combined, not each!) per week. Moderation of alcohol intake to this level decreases your risk of breast cancer and liver damage. And of course, no recreational drugs are part of a healthy lifestyle.  And absolutely no smoking or even second hand smoke. Most people know smoking can cause heart and lung diseases, but did you know it also contributes to weakening of your bones? Aging of your skin?  Yellowing of your teeth and nails?  CALCIUM AND VITAMIN D:  Adequate intake of calcium and Vitamin D are recommended.  The recommendations for exact amounts of these supplements seem to change often, but generally speaking 600 mg of calcium (either carbonate or citrate) and 800 units of Vitamin D per day seems prudent. Certain women may benefit from higher intake of Vitamin D.  If you are among these women, your doctor will have told you during your visit.    PAP SMEARS:  Pap smears, to check for cervical cancer or precancers,  have traditionally been done yearly, although recent scientific advances have shown that most women can have pap smears less often.  However, every woman still should have a physical exam from her gynecologist every year. It will include a breast check, inspection of the vulva and vagina to check for abnormal growths or skin changes, a visual exam of the cervix, and then an exam to evaluate the size and shape of the uterus and  ovaries.  And after 44 years of age, a rectal exam is indicated to check for rectal cancers. We will also provide age appropriate advice regarding health maintenance, like when you should have certain vaccines, screening for sexually transmitted diseases, bone density testing, colonoscopy, mammograms, etc.   MAMMOGRAMS:  All women over 40 years old should have a yearly mammogram. Many facilities now offer a "3D" mammogram, which may cost around $50 extra out of pocket. If possible,  we recommend you accept the option to have the 3D mammogram performed.  It both reduces the number of women who will be called back for extra views which then turn out to be normal, and it is better than the routine mammogram at detecting truly abnormal areas.    COLONOSCOPY:  Colonoscopy to screen for colon cancer is recommended for all women at age 50.  We know, you hate the idea of the prep.  We agree, BUT, having colon cancer and not knowing it is worse!!  Colon cancer so often starts as a polyp that can be seen and removed at colonscopy, which can quite literally save your life!  And if your first colonoscopy is normal and you have no family history of colon cancer, most women don't have to have it again for 10 years.  Once every ten years, you can do something that may end up saving your life, right?  We will be happy to help you get it scheduled when you are ready.    Be sure to check your insurance coverage so you understand how much it will cost.  It may be covered as a preventative service at no cost, but you should check your particular policy.     Levonorgestrel intrauterine device (IUD) What is this medicine? LEVONORGESTREL IUD (LEE voe nor jes trel) is a contraceptive (birth control) device. The device is placed inside the uterus by a health care provider. It is used to prevent pregnancy. Some devices can also be used to treat heavy bleeding that occurs during your period. This medicine may be used for other  purposes; ask your health care provider or pharmacist if you have questions. COMMON BRAND NAME(S): Minette Headland What should I tell my health care provider before I take this medicine? They need to know if you have any of these conditions:  abnormal Pap smear  cancer of the breast, uterus, or cervix  diabetes  endometritis  genital or pelvic infection now or in the past  have more than one sexual partner or your partner has more than one partner  heart disease  history of an ectopic or tubal pregnancy  immune system problems  IUD in place  liver disease or tumor  problems with blood clots or take blood-thinners  seizures  use intravenous drugs  uterus of unusual shape  vaginal bleeding that has not been explained  an unusual or allergic reaction to levonorgestrel, other hormones, silicone, or polyethylene, medicines, foods, dyes, or preservatives  pregnant or trying to get pregnant  breast-feeding How should I use this medicine? This device is placed inside the uterus by a health care professional. Talk to your pediatrician regarding the use of this medicine in children. Special care may be needed. Overdosage: If you think you have taken too much of this medicine contact a poison control center or emergency room at once. NOTE: This medicine is only for you. Do not share this medicine with others. What if I miss a dose? This does not apply. Depending on the brand of device you have inserted, the device will need to be replaced every 3 to 7 years if you wish to continue using this type of birth control. What may interact with this medicine? Do not take this medicine with any of the following medications:  amprenavir  bosentan  fosamprenavir This medicine may also interact with the following medications:  aprepitant  armodafinil  barbiturate medicines for inducing sleep or treating  seizures  bexarotene  boceprevir  griseofulvin  medicines to treat seizures like carbamazepine, ethotoin, felbamate, oxcarbazepine, phenytoin, topiramate  modafinil  pioglitazone  rifabutin  rifampin  rifapentine  some medicines to treat HIV infection like atazanavir, efavirenz, indinavir, lopinavir, nelfinavir, tipranavir, ritonavir  St. John's wort  warfarin This list may not describe all possible interactions. Give your health care provider a list of all the medicines, herbs, non-prescription drugs, or dietary supplements you use. Also tell them if you smoke, drink alcohol, or use illegal drugs. Some items may interact with your medicine. What should I watch for while using this medicine? Visit your doctor or health care professional for regular check ups. See your doctor if you or your partner has sexual contact with others, becomes HIV positive, or gets a sexual transmitted disease. This product does not protect you against HIV infection (AIDS) or other sexually transmitted diseases. You can check the placement of the IUD yourself by reaching up to the top of your vagina with clean fingers to feel the threads. Do not  pull on the threads. It is a good habit to check placement after each menstrual period. Call your doctor right away if you feel more of the IUD than just the threads or if you cannot feel the threads at all. The IUD may come out by itself. You may become pregnant if the device comes out. If you notice that the IUD has come out use a backup birth control method like condoms and call your health care provider. Using tampons will not change the position of the IUD and are okay to use during your period. This IUD can be safely scanned with magnetic resonance imaging (MRI) only under specific conditions. Before you have an MRI, tell your healthcare provider that you have an IUD in place, and which type of IUD you have in place. What side effects may I notice from  receiving this medicine? Side effects that you should report to your doctor or health care professional as soon as possible:  allergic reactions like skin rash, itching or hives, swelling of the face, lips, or tongue  fever, flu-like symptoms  genital sores  high blood pressure  no menstrual period for 6 weeks during use  pain, swelling, warmth in the leg  pelvic pain or tenderness  severe or sudden headache  signs of pregnancy  stomach cramping  sudden shortness of breath  trouble with balance, talking, or walking  unusual vaginal bleeding, discharge  yellowing of the eyes or skin Side effects that usually do not require medical attention (report to your doctor or health care professional if they continue or are bothersome):  acne  breast pain  change in sex drive or performance  changes in weight  cramping, dizziness, or faintness while the device is being inserted  headache  irregular menstrual bleeding within first 3 to 6 months of use  nausea This list may not describe all possible side effects. Call your doctor for medical advice about side effects. You may report side effects to FDA at 1-800-FDA-1088. Where should I keep my medicine? This does not apply. NOTE: This sheet is a summary. It may not cover all possible information. If you have questions about this medicine, talk to your doctor, pharmacist, or health care provider.  2021 Elsevier/Gold Standard (2020-04-22 16:27:45)

## 2020-10-15 NOTE — Progress Notes (Signed)
45 y.o. G75P0000 Married Caucasian female here for annual exam.    Having chronic vaginal yeast infections.  Has extreme itching but not a lot of discharge. Usually uses Culturelle and does [facility]-A-[facility] visit to get Diflucan.  Monistat burns. No chronic abx treatment.  Uses free and clear detergents.  Uses goat's milk soap.  Had Covid and had an asthma flare.   Has had increased migraines and was instructed not to do her Covid booster.   PCP:  Leeroy Cha, MD   Patient's last menstrual period was 10/08/2020.     Period Cycle (Days): 30 Period Duration (Days): 3-4 Period Pattern: Regular Menstrual Flow: Moderate Menstrual Control: Maxi pad Menstrual Control Change Freq (Hours): changes pad every 4 hours on heaviest day Dysmenorrhea: (!) Moderate (mod. to severe)     Sexually active: No.  The current method of family planning is abstinence.    Exercising: No.  not at this time due to [place]. Smoker:  no  Health Maintenance: Pap:  05/25/17 Neg:Neg HR HPV, 02-02-13 Neg:Neg HR HPV, 01-20-10 Neg History of abnormal Pap:  no MMG: 07-22-20 Diag.Bil.w/Rt.Br.US//Stable likely benign focal asymmetry in the central RIGHT breast and stable likely benign 5 mm mass in the UPPER RIGHT breast at the 12 o'clock position approximately 4 cm from the nipple/Lt.Br.Neg/Bil.Diag.&Rt.[place] in 1 year/BiRads3 Colonoscopy: 10/27/17 -- due in 5 years per patient due to fissures.  BMD:   n/a  Result  n/a TDaP:  09-09-15 Gardasil:   no HIV:no Hep C:no Screening Labs:  Labs done at hospital.   reports that she has never smoked. She has never used smokeless tobacco. She reports previous alcohol use. She reports that she does not use drugs.  Past Medical History:  Diagnosis Date  . Allergy-induced asthma    prn inhaler  . Cholecystitis 05/2015  . Collar bone fracture   . Complication of anesthesia    states unable to breathe when awoke from clavicle fx. surgery - had to be given neb. tx.  .  Depression   . Environmental allergies   . Family history of adverse reaction to anesthesia    states mother is hard to wake up post-op  . Family history of breast cancer   . Family history of colon cancer   . Fibroid   . Fibromyalgia   . GERD (gastroesophageal reflux disease)   . History of compression fracture of vertebral column 02/2014   T-12  . History of COVID-19 09/26/2020  . Migraines    with aura.    Past Surgical History:  Procedure Laterality Date  . BREAST BIOPSY Right 2018   benign  . BREAST SURGERY  2018   left breast bx - benign fibroadenoma  . CHOLECYSTECTOMY N/A 05/21/2015   Procedure: LAPAROSCOPIC CHOLECYSTECTOMY WITH INTRAOPERATIVE CHOLANGIOGRAM;  Surgeon: Donnie Mesa, MD;  Location: [facility];  Service: General;  Laterality: N/A;  . LAPAROSCOPIC APPENDECTOMY  06/18/2012   Procedure: APPENDECTOMY LAPAROSCOPIC;  Surgeon: Odis Hollingshead, MD;  Location: [facility];  Service: General;  Laterality: N/A;  . ORIF CLAVICLE FRACTURE Left 2000  . PERINEAL LACERATION REPAIR  1980    Current Outpatient Medications  Medication Sig Dispense Refill  . ADVAIR DISKUS 100-50 MCG/DOSE AEPB USE 1 PUFF EVERY 12 HOURS (Patient taking differently: Inhale 1 puff into the lungs in the morning and at bedtime.) 60 each 1  . ALBUTEROL IN Inhale into the lungs.    Marland Kitchen azelastine (ASTELIN) 0.1 % nasal spray USE 1 TO 2 PUFFS IN  EACH NOSTRIL ONCE TO TWICE DAILY AS NEEDED 30 mL 2  . benzonatate (TESSALON) 200 MG capsule Take 200 mg by mouth 3 (three) times daily as needed for cough.    Marland Kitchen buPROPion (WELLBUTRIN XL) 300 MG 24 hr tablet Take 300 mg by mouth daily.    . cetirizine (ZYRTEC) 10 MG tablet Take 10 mg by mouth every other day.    . Cholecalciferol-Vitamin C (VITAMIN D3-VITAMIN C PO) Take 1 tablet by mouth daily.    . colestipol (COLESTID) 1 g tablet Take 1 tablet by mouth 2 (two) times daily.    Marland Kitchen dicyclomine (BENTYL) 20 MG tablet Take 20 mg by mouth 3 (three) times  daily as needed for spasms.   0  . diphenhydrAMINE (SOMINEX) 25 MG tablet Take 25 mg by mouth at bedtime as needed for allergies.    Marland Kitchen EPINEPHrine 0.3 mg/0.3 mL IJ SOAJ injection Inject 0.3 mLs (0.3 mg total) into the muscle once. 1 Device 11  . fexofenadine (ALLEGRA) 180 MG tablet Take 180 mg by mouth every other day.    . fluticasone (FLONASE) 50 MCG/ACT nasal spray USE 1 TO 2 SPRAYS IN EACH NOSTRIL ONCE DAILY AT BEDTIME (Patient taking differently: Place 1-2 sprays into both nostrils at bedtime.) 16 g 1  . guaiFENesin (MUCINEX) 600 MG 12 hr tablet Take 600 mg by mouth 2 (two) times daily as needed for cough.    . levalbuterol (XOPENEX HFA) 45 MCG/ACT inhaler Inhale 2 puffs into the lungs as needed for wheezing.    . montelukast (SINGULAIR) 10 MG tablet Take 10 mg by mouth daily.    . Multiple Vitamins-Minerals (ONE-A-DAY WOMENS PO) Take 1 tablet by mouth daily.    . ondansetron (ZOFRAN) 4 MG tablet Take 4 mg by mouth every 8 (eight) hours as needed for nausea or vomiting.    . pantoprazole (PROTONIX) 40 MG tablet Take 40 mg by mouth every other day.  2  . QUEtiapine (SEROQUEL) 25 MG tablet Take 1 tablet by mouth at bedtime as needed (sleep).   0  . zonisamide (ZONEGRAN) 25 MG capsule Take 50 mg by mouth at bedtime.     No current facility-administered medications for this visit.    Family History  Problem Relation Age of Onset  . Gallbladder disease Father   . Mental illness Mother   . Hyperlipidemia Mother   . Breast cancer Mother 75  . Anesthesia problems Mother        hard to wake up post-op  . Colon cancer Maternal Grandfather        d. 72  . Leukemia Paternal Grandfather   . Alzheimer's disease Paternal Grandfather   . Brain cancer Paternal Uncle        benign brain tumor  . Congestive Heart Failure Maternal Grandmother     Review of Systems  All other systems reviewed and are negative.   Exam:   BP 118/80   Pulse 97   Ht 5' 3.5" (1.613 m)   Wt 151 lb (68.5 kg)    LMP 10/08/2020   SpO2 99%   BMI 26.33 kg/m     General appearance: alert, cooperative and appears stated age Head: normocephalic, without obvious abnormality, atraumatic Neck: no adenopathy, supple, symmetrical, trachea midline and thyroid normal to inspection and palpation Lungs: clear to auscultation bilaterally Breasts: normal appearance, no masses or tenderness, No nipple retraction or dimpling, No nipple discharge or bleeding, No axillary adenopathy Heart: regular rate and rhythm Abdomen: soft, non-tender;  no masses, no organomegaly Extremities: extremities normal, atraumatic, no cyanosis or edema Skin: skin color, texture, turgor normal. No rashes or lesions Lymph nodes: cervical, supraclavicular, and axillary nodes normal. Neurologic: grossly normal  Pelvic: External genitalia:  no lesions              No abnormal inguinal nodes palpated.              Urethra:  normal appearing urethra with no masses, tenderness or lesions              Bartholins and Skenes: normal                 Vagina: normal appearing vagina with normal color and discharge, no lesions              Cervix: no lesions              Pap taken: No. Bimanual Exam:  Uterus:  normal size, contour, position, consistency, mobility, non-tender              Adnexa: no mass, fullness, tenderness              Rectal exam: Yes.  .  Confirms.              Anus:  normal sphincter tone, no lesions  Chaperone was present for exam.  Assessment:   Well woman visit with normal exam. Migraine with aura.  Mother with breast cancerage 60.   Uncertain family history.   Negative genetic testing.  Status post left breast biopsy - fibroadenoma.  Hx fibroid.  10 mm fundal.  Dysmenorrhea.   Hx recurrent yeast infections.   Plan: Mammogram screening discussed. Self breast awareness reviewed. Pap and HR HPV 2023. Guidelines for Calcium, Vitamin D, regular exercise program including cardiovascular and weight bearing  exercise. We discussed recurrent yeast infections.   She will consider evaluation her for future vaginitis to help document the occurrence of the infections and work toward a suppression regimen.  We reviewed treatment options for her dysmenorrhea.   She is interested in a progesterone IUD - Mirena and [place] discussed.  Risks and benefits reviewed. Will precert and schedule appointment.  Plan for a paracervical block.  Follow up annually and prn.

## 2020-11-27 ENCOUNTER — Telehealth: Payer: Self-pay | Admitting: *Deleted

## 2020-11-27 NOTE — Telephone Encounter (Signed)
Patient left message to schedule Kyleena IUD insertion. LMP 11/27/20.   Per review of [facility] notes dated 10/15/20, current contraceptive is abstinence. Will need paracervical block.   Next available appt would be day 12 of cycle (4/4), ok to proceed if not sexually active? Or wait until next menses?   Dr. Quincy Simmonds -please advise.

## 2020-11-27 NOTE — Telephone Encounter (Signed)
How about an appointment on 12/02/20?

## 2020-11-28 MED ORDER — MISOPROSTOL 200 MCG PO TABS
ORAL_TABLET | ORAL | 0 refills | Status: DC
Start: 2020-11-28 — End: 2021-01-05

## 2020-11-28 NOTE — Addendum Note (Signed)
Addended by: Burnice Logan on: 11/28/2020 09:33 AM   Modules accepted: Orders

## 2020-11-28 NOTE — Telephone Encounter (Signed)
Spoke with patient. LMP 11/27/20.  IUD insertion scheduled for 3/29 at 1:30pm with Dr. Quincy Simmonds.  Advised to take Motrin 800 mg with food and water one hour before procedure. Patient verbalizes understanding and is agreeable.   Routing to provider for final review. Patient is agreeable to disposition. Will close encounter.  Cc: Hayley Carder

## 2020-11-28 NOTE — Telephone Encounter (Signed)
Call returned to patient. Advised per Dr. Quincy Simmonds.  Rx to verified pharmacy. Patient verbalizes understanding and is agreeable.

## 2020-11-28 NOTE — Telephone Encounter (Signed)
Please contact patient back about using Cytotec 200 mcg pv at hs the night before the procedure and the am of the procedure to help with cervical dilation and ease of placement of the IUD.  Disp:  2 RF:  none.   This may cause some cramping.

## 2020-12-02 ENCOUNTER — Encounter: Payer: Self-pay | Admitting: Obstetrics and Gynecology

## 2020-12-02 ENCOUNTER — Other Ambulatory Visit: Payer: Self-pay

## 2020-12-02 ENCOUNTER — Ambulatory Visit: Payer: Managed Care, Other (non HMO) | Admitting: Obstetrics and Gynecology

## 2020-12-02 VITALS — BP 118/72 | HR 86 | Ht 63.5 in | Wt 157.0 lb

## 2020-12-02 DIAGNOSIS — Z01812 Encounter for preprocedural laboratory examination: Secondary | ICD-10-CM

## 2020-12-02 DIAGNOSIS — Z30430 Encounter for insertion of intrauterine contraceptive device: Secondary | ICD-10-CM

## 2020-12-02 LAB — PREGNANCY, URINE: Preg Test, Ur: NEGATIVE

## 2020-12-02 NOTE — Progress Notes (Signed)
GYNECOLOGY  VISIT   HPI: 45 y.o.   Married  Caucasian  female   G1P0000 with Patient's last menstrual period was 11/27/2020.   here for IUD insertion.    Wants her periods to go away.   She used Cytotec in preparation for the IUD insertion.  Took Ibuprofen 600 mg also.   UPT - negative.   GYNECOLOGIC HISTORY: Patient's last menstrual period was 11/27/2020. Contraception: Abstinence Menopausal hormone therapy:  none Last mammogram: 07-22-20 Diag.Bil.w/Rt.Br.US//Stable likely benign focal asymmetry in the central RIGHT breast and stable likely benign 5 mm mass in the UPPER RIGHT breast at the 12 o'clock position approximately 4 cm from the nipple/Lt.Br.Neg/Bil.Diag.&Rt.[place] in 1 year/BiRads3 Last pap smear: 05/25/17 Neg:Neg HR HPV, 02-02-13 Neg:Neg HR HPV, 01-20-10 Neg        OB History    Gravida  1   Para  0   Term  0   Preterm  0   AB  0   Living  0     SAB  0   IAB  0   Ectopic  0   Multiple  0   Live Births                 Patient Active Problem List   Diagnosis Date Noted  . Genetic testing 07/27/2018  . Family history of breast cancer   . Family history of colon cancer   . Rash and nonspecific skin eruption 07/04/2015  . Chronic left maxillary sinusitis 01/04/2015  . Allergic conjunctivitis 01/04/2015  . Vertebral fracture 02/17/2014  . Seasonal and perennial allergic rhinitis 06/30/2008  . DEPRESSION 12/22/2007  . Allergic-infective asthma 12/22/2007    Past Medical History:  Diagnosis Date  . Allergy-induced asthma    prn inhaler  . Cholecystitis 05/2015  . Collar bone fracture   . Complication of anesthesia    states unable to breathe when awoke from clavicle fx. surgery - had to be given neb. tx.  . Depression   . Environmental allergies   . Family history of adverse reaction to anesthesia    states mother is hard to wake up post-op  . Family history of breast cancer   . Family history of colon cancer   . Fibroid   . Fibromyalgia    . GERD (gastroesophageal reflux disease)   . History of compression fracture of vertebral column 02/2014   T-12  . History of COVID-19 09/26/2020  . Migraines    with aura.    Past Surgical History:  Procedure Laterality Date  . BREAST BIOPSY Right 2018   benign  . BREAST SURGERY  2018   left breast bx - benign fibroadenoma  . CHOLECYSTECTOMY N/A 05/21/2015   Procedure: LAPAROSCOPIC CHOLECYSTECTOMY WITH INTRAOPERATIVE CHOLANGIOGRAM;  Surgeon: Donnie Mesa, MD;  Location: [facility];  Service: General;  Laterality: N/A;  . LAPAROSCOPIC APPENDECTOMY  06/18/2012   Procedure: APPENDECTOMY LAPAROSCOPIC;  Surgeon: Odis Hollingshead, MD;  Location: [facility];  Service: General;  Laterality: N/A;  . ORIF CLAVICLE FRACTURE Left 2000  . PERINEAL LACERATION REPAIR  1980    Current Outpatient Medications  Medication Sig Dispense Refill  . ADVAIR DISKUS 100-50 MCG/DOSE AEPB USE 1 PUFF EVERY 12 HOURS (Patient taking differently: Inhale 1 puff into the lungs in the morning and at bedtime.) 60 each 1  . albuterol (PROVENTIL) (2.5 MG/3ML) 0.083% nebulizer solution Take 2.5 mg by nebulization every 6 (six) hours as needed.    Marland Kitchen albuterol (PROVENTIL) (2.5 MG/3ML)  0.083% nebulizer solution 1 tablet    . ALBUTEROL IN Inhale into the lungs.    Marland Kitchen azelastine (ASTELIN) 0.1 % nasal spray USE 1 TO 2 PUFFS IN EACH NOSTRIL ONCE TO TWICE DAILY AS NEEDED 30 mL 2  . buPROPion (WELLBUTRIN XL) 300 MG 24 hr tablet Take 300 mg by mouth daily.    . cetirizine (ZYRTEC) 10 MG tablet Take 10 mg by mouth every other day.    . Cholecalciferol-Vitamin C (VITAMIN D3-VITAMIN C PO) Take 1 tablet by mouth daily.    . colestipol (COLESTID) 1 g tablet Take 1 tablet by mouth 2 (two) times daily.    Marland Kitchen dicyclomine (BENTYL) 20 MG tablet Take 20 mg by mouth 3 (three) times daily as needed for spasms.   0  . diphenhydrAMINE (SOMINEX) 25 MG tablet Take 25 mg by mouth at bedtime as needed for allergies.    Marland Kitchen EPINEPHrine 0.3  mg/0.3 mL IJ SOAJ injection Inject 0.3 mLs (0.3 mg total) into the muscle once. 1 Device 11  . fexofenadine (ALLEGRA) 180 MG tablet Take 180 mg by mouth every other day.    . fluticasone (FLONASE) 50 MCG/ACT nasal spray USE 1 TO 2 SPRAYS IN EACH NOSTRIL ONCE DAILY AT BEDTIME (Patient taking differently: Place 1-2 sprays into both nostrils at bedtime.) 16 g 1  . levalbuterol (XOPENEX HFA) 45 MCG/ACT inhaler Inhale 2 puffs into the lungs as needed for wheezing.    . misoprostol (CYTOTEC) 200 MCG tablet Place one tablet vaginally night before procedure and one tablet vaginally morning of procedure. 2 tablet 0  . montelukast (SINGULAIR) 10 MG tablet Take 10 mg by mouth daily.    . Multiple Vitamins-Minerals (ONE-A-DAY WOMENS PO) Take 1 tablet by mouth daily.    . ondansetron (ZOFRAN) 4 MG tablet Take 4 mg by mouth every 8 (eight) hours as needed for nausea or vomiting.    . pantoprazole (PROTONIX) 40 MG tablet Take 40 mg by mouth every other day.  2  . QUEtiapine (SEROQUEL) 25 MG tablet Take 1 tablet by mouth at bedtime as needed (sleep).   0  . zonisamide (ZONEGRAN) 25 MG capsule Take 50 mg by mouth at bedtime.     No current facility-administered medications for this visit.     ALLERGIES: Strawberry extract, Erythromycin, Sulfonamide derivatives, Tetracyclines & related, Doxycycline hyclate, Adhesive [tape], Iodine, and Latex  Family History  Problem Relation Age of Onset  . Gallbladder disease Father   . Mental illness Mother   . Hyperlipidemia Mother   . Breast cancer Mother 49  . Anesthesia problems Mother        hard to wake up post-op  . Colon cancer Maternal Grandfather        d. 18  . Leukemia Paternal Grandfather   . Alzheimer's disease Paternal Grandfather   . Brain cancer Paternal Uncle        benign brain tumor  . Congestive Heart Failure Maternal Grandmother     Social History   Socioeconomic History  . Marital status: Married    Spouse name: Not on file  . Number of  children: 0  . Years of education: Not on file  . Highest education level: Not on file  Occupational History  . Occupation: Programme researcher, broadcasting/film/video: [employer]  Tobacco Use  . Smoking status: Never Smoker  . Smokeless tobacco: Never Used  Vaping Use  . Vaping Use: Never used  Substance and Sexual Activity  .  Alcohol use: Not Currently    Alcohol/week: 0.0 standard drinks    Comment: rarely  . Drug use: No  . Sexual activity: Yes    Partners: Male    Birth control/protection: None  Other Topics Concern  . Not on file  Social History Narrative  . Not on file   Social Determinants of Health   Financial Resource Strain: Not on file  Food Insecurity: Not on file  Transportation Needs: Not on file  Physical Activity: Not on file  Stress: Not on file  Social Connections: Not on file  Intimate Partner Violence: Not on file    Review of Systems  All other systems reviewed and are negative.   PHYSICAL EXAMINATION:    BP 118/72   Pulse 86   Ht 5' 3.5" (1.613 m)   Wt 157 lb (71.2 kg)   LMP 11/27/2020   SpO2 98%   BMI 27.38 kg/m     General appearance: alert, cooperative and appears stated age   Pelvic: External genitalia:  no lesions              Urethra:  normal appearing urethra with no masses, tenderness or lesions              Bartholins and Skenes: normal                 Vagina: normal appearing vagina with normal color and discharge, no lesions              Cervix: no lesions                Bimanual Exam:  Uterus:  normal size, contour, position, consistency, mobility, non-tender              Adnexa: no mass, fullness, tenderness            Mirena IUD insertion - lot [id number], exp June 2024. Consent for procedure. Sterile prep with Hibiclens.  Paracervical block with 10 cc 1% lidocaine, lot [id number], exp 03/2024. Tenaculum to anterior cervical lip. Os finder used.  Uterus sounded to 7.5 cm.  Mirena IUD placed without difficulty.  Strings trimmed  to 3 cm.  Minimal EBL.  No complications.  Repeat BM exam, no change.  Chaperone was present for exam: Marisa Sprinkles.  ASSESSMENT  Mirena IUD insertion.   PLAN  Post IUD instructions given.  Back up protection for one week.  IUD card to patient.  Fu in 4 - 6 weeks.

## 2020-12-02 NOTE — Patient Instructions (Signed)
Intrauterine Device Insertion, Care After This sheet gives you information about how to care for yourself after your procedure. Your health care provider may also give you more specific instructions. If you have problems or questions, contact your health care provider. What can I expect after the procedure? After the procedure, it is common to have:  Cramps and pain in the abdomen.  Bleeding. It may be light or heavy. This may last for a few days.  Lower back pain.  Dizziness.  Headaches.  Nausea. Follow these instructions at home:  Before resuming sexual activity, check to make sure that you can feel the IUD string or strings. You should be able to feel the end of the string below the opening of your cervix. If your IUD string is in place, you may resume sexual activity. ? If you had a hormonal IUD inserted more than 7 days after your most recent period started, you will need to use a backup method of birth control for 7 days after IUD insertion. Ask your health care provider whether this applies to you.  Continue to check that the IUD is still in place by feeling for the strings after every menstrual period, or once a month.  An IUD will not protect you from sexually transmitted infections (STIs). Use methods to prevent the exchange of body fluids between partners (barrier protection) every time you have sex. Barrier protection can be used during oral, vaginal, or anal sex. Commonly used barrier methods include: ? Female condom. ? Female condom. ? Dental dam.  Take over-the-counter and prescription medicines only as told by your health care provider.  Keep all follow-up visits as told by your health care provider. This is important.   Contact a health care provider if:  You feel light-headed or weak.  You have any of the following problems with your IUD string or strings: ? The string bothers or hurts you or your sexual partner. ? You cannot feel the string. ? The string has  gotten longer.  You can feel the IUD in your vagina.  You think you may be pregnant, or you miss your menstrual period.  You think you may have a sexually transmitted infection (STI). Get help right away if:  You have flu-like symptoms, such as tiredness (fatigue) and muscle aches.  You have a fever and chills.  You have bleeding that is heavier or lasts longer than a normal menstrual cycle.  You have abnormal or bad-smelling discharge from your vagina.  You develop abdominal pain that is new, is getting worse, or is not in the same area of earlier cramping and pain.  You have pain during sexual activity. Summary  After the procedure, it is common to have cramps and pain in the abdomen. It is also common to have light bleeding or heavier bleeding that is like your menstrual period.  Continue to check that the IUD is still in place by feeling for the strings after every menstrual period, or once a month.  Keep all follow-up visits as told by your health care provider. This is important.  Contact your health care provider if you have problems with your IUD strings, such as the string getting longer or bothering you or your sexual partner. This information is not intended to replace advice given to you by your health care provider. Make sure you discuss any questions you have with your health care provider. Document Revised: 08/14/2019 Document Reviewed: 08/14/2019 Elsevier Patient Education  Monica Hansen.

## 2021-01-05 ENCOUNTER — Ambulatory Visit: Payer: Managed Care, Other (non HMO) | Admitting: Obstetrics and Gynecology

## 2021-01-05 ENCOUNTER — Encounter: Payer: Self-pay | Admitting: Obstetrics and Gynecology

## 2021-01-05 ENCOUNTER — Other Ambulatory Visit: Payer: Self-pay

## 2021-01-05 VITALS — BP 114/76 | HR 81 | Ht 63.5 in | Wt 157.0 lb

## 2021-01-05 DIAGNOSIS — Z113 Encounter for screening for infections with a predominantly sexual mode of transmission: Secondary | ICD-10-CM

## 2021-01-05 DIAGNOSIS — N939 Abnormal uterine and vaginal bleeding, unspecified: Secondary | ICD-10-CM

## 2021-01-05 DIAGNOSIS — R102 Pelvic and perineal pain: Secondary | ICD-10-CM

## 2021-01-05 DIAGNOSIS — Z30431 Encounter for routine checking of intrauterine contraceptive device: Secondary | ICD-10-CM | POA: Diagnosis not present

## 2021-01-05 LAB — PREGNANCY, URINE: Preg Test, Ur: NEGATIVE

## 2021-01-05 NOTE — Progress Notes (Signed)
GYNECOLOGY  VISIT   HPI: 45 y.o.   Married  Caucasian  female   G1P0000 with Patient's last menstrual period was 11/27/2020 (exact date).   here for Mirena IUD follow up.  Patient complaining of cramping and spotting daily around 2:00pm. Cramping increases throughout the night.  Feels like she is on her period every day now.  Pain with movement also.  Taking Tylenol for the pain.  Pain is not preventing her from sleeping, eating. It can limit her physical activity.   No change in partner but she does want STD testing.   GYNECOLOGIC HISTORY: Patient's last menstrual period was 11/27/2020 (exact date). Contraception: Mirena IUD 12-02-20 Menopausal hormone therapy:  NA Last mammogram: 07-22-20 Diag.Bil.w/Rt.Br.US//Stable likely benign focal asymmetry in the central RIGHT breast and stable likely benign 5 mm mass in the UPPER RIGHT breast at the 12 o'clock position approximately 4 cm from the nipple/Lt.Br.Neg/Bil.Diag.&Rt.[place] in 1 year/BiRads3 Last pap smear: 05/25/17 Neg:Neg HR HPV, 02-02-13 Neg:Neg HR HPV, 01-20-10 Neg        OB History    Gravida  1   Para  0   Term  0   Preterm  0   AB  0   Living  0     SAB  0   IAB  0   Ectopic  0   Multiple  0   Live Births                 Patient Active Problem List   Diagnosis Date Noted  . Genetic testing 07/27/2018  . Family history of breast cancer   . Family history of colon cancer   . Rash and nonspecific skin eruption 07/04/2015  . Chronic left maxillary sinusitis 01/04/2015  . Allergic conjunctivitis 01/04/2015  . Vertebral fracture 02/17/2014  . Seasonal and perennial allergic rhinitis 06/30/2008  . DEPRESSION 12/22/2007  . Allergic-infective asthma 12/22/2007    Past Medical History:  Diagnosis Date  . Allergy-induced asthma    prn inhaler  . Cholecystitis 05/2015  . Collar bone fracture   . Complication of anesthesia    states unable to breathe when awoke from clavicle fx. surgery - had to be  given neb. tx.  . Depression   . Environmental allergies   . Family history of adverse reaction to anesthesia    states mother is hard to wake up post-op  . Family history of breast cancer   . Family history of colon cancer   . Fibroid   . Fibromyalgia   . GERD (gastroesophageal reflux disease)   . History of compression fracture of vertebral column 02/2014   T-12  . History of COVID-19 09/26/2020  . Migraines    with aura.    Past Surgical History:  Procedure Laterality Date  . BREAST BIOPSY Right 2018   benign  . BREAST SURGERY  2018   left breast bx - benign fibroadenoma  . CHOLECYSTECTOMY N/A 05/21/2015   Procedure: LAPAROSCOPIC CHOLECYSTECTOMY WITH INTRAOPERATIVE CHOLANGIOGRAM;  Surgeon: Donnie Mesa, MD;  Location: [facility];  Service: General;  Laterality: N/A;  . LAPAROSCOPIC APPENDECTOMY  06/18/2012   Procedure: APPENDECTOMY LAPAROSCOPIC;  Surgeon: Odis Hollingshead, MD;  Location: [facility];  Service: General;  Laterality: N/A;  . ORIF CLAVICLE FRACTURE Left 2000  . PERINEAL LACERATION REPAIR  1980    Current Outpatient Medications  Medication Sig Dispense Refill  . ADVAIR DISKUS 100-50 MCG/DOSE AEPB USE 1 PUFF EVERY 12 HOURS (Patient taking differently: Inhale 1  puff into the lungs in the morning and at bedtime.) 60 each 1  . albuterol (PROVENTIL) (2.5 MG/3ML) 0.083% nebulizer solution Take 2.5 mg by nebulization every 6 (six) hours as needed.    Marland Kitchen albuterol (PROVENTIL) (2.5 MG/3ML) 0.083% nebulizer solution 1 tablet    . ALBUTEROL IN Inhale into the lungs.    Marland Kitchen azelastine (ASTELIN) 0.1 % nasal spray USE 1 TO 2 PUFFS IN EACH NOSTRIL ONCE TO TWICE DAILY AS NEEDED 30 mL 2  . buPROPion (WELLBUTRIN XL) 300 MG 24 hr tablet Take 300 mg by mouth daily.    . cetirizine (ZYRTEC) 10 MG tablet Take 10 mg by mouth every other day.    . Cholecalciferol-Vitamin C (VITAMIN D3-VITAMIN C PO) Take 1 tablet by mouth daily.    . colestipol (COLESTID) 1 g tablet Take 1  tablet by mouth 2 (two) times daily.    Marland Kitchen dicyclomine (BENTYL) 20 MG tablet Take 20 mg by mouth 3 (three) times daily as needed for spasms.   0  . diphenhydrAMINE (SOMINEX) 25 MG tablet Take 25 mg by mouth at bedtime as needed for allergies.    Marland Kitchen EPINEPHrine 0.3 mg/0.3 mL IJ SOAJ injection Inject 0.3 mLs (0.3 mg total) into the muscle once. 1 Device 11  . fexofenadine (ALLEGRA) 180 MG tablet Take 180 mg by mouth every other day.    . fluticasone (FLONASE) 50 MCG/ACT nasal spray USE 1 TO 2 SPRAYS IN EACH NOSTRIL ONCE DAILY AT BEDTIME (Patient taking differently: Place 1-2 sprays into both nostrils at bedtime.) 16 g 1  . levalbuterol (XOPENEX HFA) 45 MCG/ACT inhaler Inhale 2 puffs into the lungs as needed for wheezing.    . montelukast (SINGULAIR) 10 MG tablet Take 10 mg by mouth daily.    . Multiple Vitamins-Minerals (ONE-A-DAY WOMENS PO) Take 1 tablet by mouth daily.    . ondansetron (ZOFRAN) 4 MG tablet Take 4 mg by mouth every 8 (eight) hours as needed for nausea or vomiting.    . pantoprazole (PROTONIX) 40 MG tablet Take 40 mg by mouth every other day.  2  . QUEtiapine (SEROQUEL) 25 MG tablet Take 1 tablet by mouth at bedtime as needed (sleep).   0  . zonisamide (ZONEGRAN) 25 MG capsule Take 50 mg by mouth at bedtime.     No current facility-administered medications for this visit.     ALLERGIES: Strawberry extract, Erythromycin, Sulfonamide derivatives, Tetracyclines & related, Doxycycline hyclate, Adhesive [tape], Iodine, and Latex  Family History  Problem Relation Age of Onset  . Gallbladder disease Father   . Mental illness Mother   . Hyperlipidemia Mother   . Breast cancer Mother 31  . Anesthesia problems Mother        hard to wake up post-op  . Colon cancer Maternal Grandfather        d. 78  . Leukemia Paternal Grandfather   . Alzheimer's disease Paternal Grandfather   . Brain cancer Paternal Uncle        benign brain tumor  . Congestive Heart Failure Maternal Grandmother      Social History   Socioeconomic History  . Marital status: Married    Spouse name: Not on file  . Number of children: 0  . Years of education: Not on file  . Highest education level: Not on file  Occupational History  . 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 Smoking status: Never Smoker  . Smokeless tobacco: Never  Used  Vaping Use  . Vaping Use: Never used  Substance and Sexual Activity  . Alcohol use: Not Currently    Alcohol/week: 0.0 standard drinks    Comment: rarely  . Drug use: No  . Sexual activity: Yes    Partners: Male    Birth control/protection: None  Other Topics Concern  . Not on file  Social History Narrative  . Not on file   Social Determinants of Health   Financial Resource Strain: Not on file  Food Insecurity: Not on file  Transportation Needs: Not on file  Physical Activity: Not on file  Stress: Not on file  Social Connections: Not on file  Intimate Partner Violence: Not on file    Review of Systems  All other systems reviewed and are negative.   PHYSICAL EXAMINATION:    BP 114/76   Pulse 81   Ht 5' 3.5" (1.613 m)   Wt 157 lb (71.2 kg)   LMP 11/27/2020 (Exact Date)   SpO2 99%   BMI 27.38 kg/m     General appearance: alert, cooperative and appears stated age   Pelvic: External genitalia:  no lesions              Urethra:  normal appearing urethra with no masses, tenderness or lesions              Bartholins and Skenes: normal                 Vagina: normal appearing vagina with normal color and discharge, no lesions              Cervix: no lesions.  Light brown blood.  IUD strings noted.                 Bimanual Exam:  Uterus:  normal size, contour, position, consistency, mobility, mildly tender              Adnexa: no mass, fullness, tenderness    Chaperone was present for exam.  ASSESSMENT  IUD check up. Abnormal uterine bleeding.  Pelvic pain.  STD screening.   PLAN  UPT now.   Return for pelvic ultrasound this week and test for GC/CT/trich then.  Will check HIV, RPR, Hep B and C today.   20 min total time was spent for this patient encounter, including preparation, face-to-face counseling with the patient, coordination of care, and documentation of the encounter.

## 2021-01-06 ENCOUNTER — Other Ambulatory Visit: Payer: Managed Care, Other (non HMO) | Admitting: Obstetrics and Gynecology

## 2021-01-06 ENCOUNTER — Other Ambulatory Visit: Payer: Managed Care, Other (non HMO)

## 2021-01-07 NOTE — Progress Notes (Signed)
GYNECOLOGY  VISIT   HPI: 45 y.o.   Married  Caucasian  female   G1P0000 with Patient's last menstrual period was 11/27/2020 (exact date).   here for pelvic ultrasound for pelvic pain and abnormal uterine bleeding with Mirena IUD.   Recent placement of Mirena IUD on 12/02/20.  Also has irritable bowel.   Neg HIV, RPR, Hep C aby. Would like to complete STD screening today for GC/CT/trich so she gave a urine specimen for this.   GYNECOLOGIC HISTORY: Patient's last menstrual period was 11/27/2020 (exact date). Contraception: Mirena IUD 12-02-20 Menopausal hormone therapy:  none Last mammogram:  07-22-20 Diag.Bil.w/Rt.Br.US//Stable likely benign focal asymmetry in the central RIGHT breast and stable likely benign 5 mm mass in the UPPER RIGHT breast at the 12 o'clock position approximately 4 cm from the nipple/Lt.Br.Neg/Bil.Diag.&Rt.[place] in 1 year/BiRads3 Last pap smear: 05/25/17 Neg:Neg HR HPV, 02-02-13 Neg:Neg HR HPV, 01-20-10 Neg         OB History    Gravida  1   Para  0   Term  0   Preterm  0   AB  0   Living  0     SAB  0   IAB  0   Ectopic  0   Multiple  0   Live Births                 Patient Active Problem List   Diagnosis Date Noted  . Genetic testing 07/27/2018  . Family history of breast cancer   . Family history of colon cancer   . Rash and nonspecific skin eruption 07/04/2015  . Chronic left maxillary sinusitis 01/04/2015  . Allergic conjunctivitis 01/04/2015  . Vertebral fracture 02/17/2014  . Seasonal and perennial allergic rhinitis 06/30/2008  . DEPRESSION 12/22/2007  . Allergic-infective asthma 12/22/2007    Past Medical History:  Diagnosis Date  . Allergy-induced asthma    prn inhaler  . Cholecystitis 05/2015  . Collar bone fracture   . Complication of anesthesia    states unable to breathe when awoke from clavicle fx. surgery - had to be given neb. tx.  . Depression   . Environmental allergies   . Family history of adverse reaction  to anesthesia    states mother is hard to wake up post-op  . Family history of breast cancer   . Family history of colon cancer   . Fibroid   . Fibromyalgia   . GERD (gastroesophageal reflux disease)   . History of compression fracture of vertebral column 02/2014   T-12  . History of COVID-19 09/26/2020  . Migraines    with aura.    Past Surgical History:  Procedure Laterality Date  . BREAST BIOPSY Right 2018   benign  . BREAST SURGERY  2018   left breast bx - benign fibroadenoma  . CHOLECYSTECTOMY N/A 05/21/2015   Procedure: LAPAROSCOPIC CHOLECYSTECTOMY WITH INTRAOPERATIVE CHOLANGIOGRAM;  Surgeon: Donnie Mesa, MD;  Location: [facility];  Service: General;  Laterality: N/A;  . LAPAROSCOPIC APPENDECTOMY  06/18/2012   Procedure: APPENDECTOMY LAPAROSCOPIC;  Surgeon: Odis Hollingshead, MD;  Location: [facility];  Service: General;  Laterality: N/A;  . ORIF CLAVICLE FRACTURE Left 2000  . PERINEAL LACERATION REPAIR  1980    Current Outpatient Medications  Medication Sig Dispense Refill  . ADVAIR DISKUS 100-50 MCG/DOSE AEPB USE 1 PUFF EVERY 12 HOURS (Patient taking differently: Inhale 1 puff into the lungs in the morning and at bedtime.) 60 each 1  .  albuterol (PROVENTIL) (2.5 MG/3ML) 0.083% nebulizer solution Take 2.5 mg by nebulization every 6 (six) hours as needed.    Marland Kitchen albuterol (PROVENTIL) (2.5 MG/3ML) 0.083% nebulizer solution 1 tablet    . ALBUTEROL IN Inhale into the lungs.    Marland Kitchen azelastine (ASTELIN) 0.1 % nasal spray USE 1 TO 2 PUFFS IN EACH NOSTRIL ONCE TO TWICE DAILY AS NEEDED 30 mL 2  . buPROPion (WELLBUTRIN XL) 300 MG 24 hr tablet Take 300 mg by mouth daily.    . cetirizine (ZYRTEC) 10 MG tablet Take 10 mg by mouth every other day.    . Cholecalciferol-Vitamin C (VITAMIN D3-VITAMIN C PO) Take 1 tablet by mouth daily.    . colestipol (COLESTID) 1 g tablet Take 1 tablet by mouth 2 (two) times daily.    Marland Kitchen dicyclomine (BENTYL) 20 MG tablet Take 20 mg by mouth 3  (three) times daily as needed for spasms.   0  . diphenhydrAMINE (SOMINEX) 25 MG tablet Take 25 mg by mouth at bedtime as needed for allergies.    Marland Kitchen EPINEPHrine 0.3 mg/0.3 mL IJ SOAJ injection Inject 0.3 mLs (0.3 mg total) into the muscle once. 1 Device 11  . fexofenadine (ALLEGRA) 180 MG tablet Take 180 mg by mouth every other day.    . fluticasone (FLONASE) 50 MCG/ACT nasal spray USE 1 TO 2 SPRAYS IN EACH NOSTRIL ONCE DAILY AT BEDTIME (Patient taking differently: Place 1-2 sprays into both nostrils at bedtime.) 16 g 1  . levalbuterol (XOPENEX HFA) 45 MCG/ACT inhaler Inhale 2 puffs into the lungs as needed for wheezing.    . montelukast (SINGULAIR) 10 MG tablet Take 10 mg by mouth daily.    . Multiple Vitamins-Minerals (ONE-A-DAY WOMENS PO) Take 1 tablet by mouth daily.    . ondansetron (ZOFRAN) 4 MG tablet Take 4 mg by mouth every 8 (eight) hours as needed for nausea or vomiting.    . pantoprazole (PROTONIX) 40 MG tablet Take 40 mg by mouth every other day.  2  . QUEtiapine (SEROQUEL) 25 MG tablet Take 1 tablet by mouth at bedtime as needed (sleep).   0  . zonisamide (ZONEGRAN) 25 MG capsule Take 50 mg by mouth at bedtime.     No current facility-administered medications for this visit.     ALLERGIES: Strawberry extract, Erythromycin, Sulfonamide derivatives, Tetracyclines & related, Doxycycline hyclate, Monistat [miconazole], Adhesive [tape], Iodine, and Latex  Family History  Problem Relation Age of Onset  . Gallbladder disease Father   . Mental illness Mother   . Hyperlipidemia Mother   . Breast cancer Mother 5  . Anesthesia problems Mother        hard to wake up post-op  . Colon cancer Maternal Grandfather        d. 71  . Leukemia Paternal Grandfather   . Alzheimer's disease Paternal Grandfather   . Brain cancer Paternal Uncle        benign brain tumor  . Congestive Heart Failure Maternal Grandmother     Social History   Socioeconomic History  . Marital status: Married     Spouse name: Not on file  . Number of children: 0  . Years of education: Not on file  . Highest education level: Not on file  Occupational History  . Occupation: Programme researcher, broadcasting/film/video: [employer]  Tobacco Use  . Smoking status: Never Smoker  . Smokeless tobacco: Never Used  Vaping Use  . Vaping Use: Never used  Substance and  Sexual Activity  . Alcohol use: Not Currently    Alcohol/week: 0.0 standard drinks    Comment: rarely  . Drug use: No  . Sexual activity: Yes    Partners: Male    Birth control/protection: None  Other Topics Concern  . Not on file  Social History Narrative  . Not on file   Social Determinants of Health   Financial Resource Strain: Not on file  Food Insecurity: Not on file  Transportation Needs: Not on file  Physical Activity: Not on file  Stress: Not on file  Social Connections: Not on file  Intimate Partner Violence: Not on file    Review of Systems  All other systems reviewed and are negative.   PHYSICAL EXAMINATION:    BP 112/78   Pulse 80   Ht 5\' 3"  (1.6 m)   Wt 157 lb (71.2 kg)   LMP 11/27/2020 (Exact Date)   SpO2 99%   BMI 27.81 kg/m     General appearance: alert, cooperative and appears stated age  Pelvic US  Uterus 8.26 x 5.51 x 4.22 cm.   No myometrial masses.  EMS 4.75 mm.  IUD in normal position in endometrial canal.  Right ovary: 2.1 x 1.97 x 2.19 cm.  20 x 15 mm follicle, avascular and echofree.  Left ovary:  2.16 x 1.58 x 1.58 cm. No adnexal masses.  No free fluid.   ASSESSMENT  Abnormal uterine bleeding.  Mirena IUD check up.   STD screening.   PLAN  Ultrasound images and report reviewed and reassurance given regarding IUD location.  We discussed bleeding profiles with Mirena IUD and irregularity of bleeding that may last for months. Will check for GC/CT/trich.  Fu prn.   19 min  total time was spent for this patient encounter, including preparation, face-to-face counseling with the patient,  coordination of care, and documentation of the encounter.

## 2021-01-08 ENCOUNTER — Ambulatory Visit: Payer: Managed Care, Other (non HMO) | Admitting: Obstetrics and Gynecology

## 2021-01-08 ENCOUNTER — Encounter: Payer: Self-pay | Admitting: Obstetrics and Gynecology

## 2021-01-08 ENCOUNTER — Ambulatory Visit (INDEPENDENT_AMBULATORY_CARE_PROVIDER_SITE_OTHER): Payer: Managed Care, Other (non HMO)

## 2021-01-08 ENCOUNTER — Other Ambulatory Visit: Payer: Self-pay

## 2021-01-08 ENCOUNTER — Telehealth: Payer: Self-pay | Admitting: Obstetrics and Gynecology

## 2021-01-08 VITALS — BP 112/78 | HR 80 | Ht 63.0 in | Wt 157.0 lb

## 2021-01-08 DIAGNOSIS — Z30431 Encounter for routine checking of intrauterine contraceptive device: Secondary | ICD-10-CM

## 2021-01-08 DIAGNOSIS — R102 Pelvic and perineal pain: Secondary | ICD-10-CM

## 2021-01-08 DIAGNOSIS — Z113 Encounter for screening for infections with a predominantly sexual mode of transmission: Secondary | ICD-10-CM

## 2021-01-08 DIAGNOSIS — N939 Abnormal uterine and vaginal bleeding, unspecified: Secondary | ICD-10-CM

## 2021-01-08 NOTE — Telephone Encounter (Signed)
Request provided to [facility] in the lab.

## 2021-01-08 NOTE — Telephone Encounter (Signed)
Please add HBSAg testing to blood drawn on 01/05/21.   Code is STD screening.   Thank you.

## 2021-01-09 LAB — SURESWAB CT/NG/T. VAGINALIS
C. trachomatis RNA, TMA: NOT DETECTED
N. gonorrhoeae RNA, TMA: NOT DETECTED
Trichomonas vaginalis RNA: NOT DETECTED

## 2021-01-13 NOTE — Telephone Encounter (Signed)
Request provided to Roxanne in lab to follow up on this added test as I do not see result.

## 2021-01-14 LAB — HEPATITIS C ANTIBODY
Hepatitis C Ab: NONREACTIVE
SIGNAL TO CUT-OFF: 0.02 (ref <1.00)

## 2021-01-14 LAB — HEPATITIS B SURFACE ANTIGEN: Hepatitis B Surface Ag: NONREACTIVE

## 2021-01-14 LAB — HIV ANTIBODY (ROUTINE TESTING W REFLEX): HIV 1&2 Ab, 4th Generation: NONREACTIVE

## 2021-01-14 LAB — RPR: RPR Ser Ql: NONREACTIVE

## 2021-09-29 ENCOUNTER — Other Ambulatory Visit: Payer: Self-pay | Admitting: Internal Medicine

## 2021-09-29 DIAGNOSIS — K529 Noninfective gastroenteritis and colitis, unspecified: Secondary | ICD-10-CM

## 2021-09-30 ENCOUNTER — Other Ambulatory Visit: Payer: Self-pay | Admitting: Internal Medicine

## 2021-09-30 ENCOUNTER — Ambulatory Visit
Admission: RE | Admit: 2021-09-30 | Discharge: 2021-09-30 | Disposition: A | Discharge status: Home / Self Care | Payer: Managed Care, Other (non HMO) | Source: Ambulatory Visit | Attending: Internal Medicine | Admitting: Internal Medicine

## 2021-09-30 DIAGNOSIS — K529 Noninfective gastroenteritis and colitis, unspecified: Secondary | ICD-10-CM

## 2021-10-07 ENCOUNTER — Encounter: Payer: Self-pay | Admitting: Obstetrics and Gynecology

## 2021-10-07 ENCOUNTER — Ambulatory Visit: Payer: Managed Care, Other (non HMO) | Admitting: Obstetrics and Gynecology

## 2021-10-07 ENCOUNTER — Other Ambulatory Visit: Payer: Self-pay

## 2021-10-07 VITALS — BP 120/76 | HR 85 | Ht 63.5 in | Wt 157.0 lb

## 2021-10-07 DIAGNOSIS — R102 Pelvic and perineal pain: Secondary | ICD-10-CM | POA: Diagnosis not present

## 2021-10-07 DIAGNOSIS — Z3009 Encounter for other general counseling and advice on contraception: Secondary | ICD-10-CM

## 2021-10-07 NOTE — Progress Notes (Signed)
GYNECOLOGY  VISIT   HPI: 46 y.o.   Married  Caucasian  female   G1P0000 with No LMP recorded. (Menstrual status: IUD).   here for vaginal spotting after physicial activity. Patient had GI virus 1-2 weeks ago and has lost 15 lbs. She did have CT scan last week and needs EGD. She is now having low pelvic discomfort.  She just took 5 days of Cipro and will do a course of Augmentin for 10 days starting today.   Had a Mirena IUD placed 12/02/20.  She bleeds and has cramping with physical activity.  Feels like bad menstrual cramping.  Her pain correlates with placement of the IUD.   She likes having relief of heavy menstrual flow with low hormonal exposure.   Pelvic US 01/08/21 done for pelvic pain and abnormal uterine bleeding and it showed the IUD in normal position and normal ovaries.   Negative GC/CT/trich testing on 01/08/21.   Denies pain or discomfort with sex.  No current fevers.   GYNECOLOGIC HISTORY: No LMP recorded. (Menstrual status: IUD). Contraception:  Mirena IUD 12-02-20 Menopausal hormone therapy:  none Last mammogram:  07-22-20 Diag.Bil.w/Rt.Br.US//Stable likely benign focal asymmetry in the central RIGHT breast and stable likely benign 5 mm mass in the UPPER RIGHT breast at the 12 o'clock position approximately 4 cm from the nipple/Lt.Br.Neg/Bil.Diag.&Rt.[place] in 1 year/BiRads3 Last pap smear:   05/25/17 Neg:Neg HR HPV, 02-02-13 Neg:Neg HR HPV, 01-20-10 Neg          OB History     Gravida  1   Para  0   Term  0   Preterm  0   AB  0   Living  0      SAB  0   IAB  0   Ectopic  0   Multiple  0   Live Births                 Patient Active Problem List   Diagnosis Date Noted   Genetic testing 07/27/2018   Family history of breast cancer    Family history of colon cancer    Rash and nonspecific skin eruption 07/04/2015   Chronic left maxillary sinusitis 01/04/2015   Allergic conjunctivitis 01/04/2015   Vertebral fracture 02/17/2014   Seasonal and  perennial allergic rhinitis 06/30/2008   DEPRESSION 12/22/2007   Allergic-infective asthma 12/22/2007    Past Medical History:  Diagnosis Date   Allergy-induced asthma    prn inhaler   Cholecystitis 05/2015   Collar bone fracture    Complication of anesthesia    states unable to breathe when awoke from clavicle fx. surgery - had to be given neb. tx.   Depression    Environmental allergies    Family history of adverse reaction to anesthesia    states mother is hard to wake up post-op   Family history of breast cancer    Family history of colon cancer    Fibroid    Fibromyalgia    GERD (gastroesophageal reflux disease)    History of compression fracture of vertebral column 02/2014   T-12   History of COVID-19 09/26/2020   Migraines    with aura.    Past Surgical History:  Procedure Laterality Date   BREAST BIOPSY Right 2018   benign   BREAST SURGERY  2018   left breast bx - benign fibroadenoma   CHOLECYSTECTOMY N/A 05/21/2015   Procedure: LAPAROSCOPIC CHOLECYSTECTOMY WITH INTRAOPERATIVE CHOLANGIOGRAM;  Surgeon: Donnie Mesa, MD;  Location: [facility];  Service: General;  Laterality: N/A;   LAPAROSCOPIC APPENDECTOMY  06/18/2012   Procedure: APPENDECTOMY LAPAROSCOPIC;  Surgeon: Odis Hollingshead, MD;  Location: [facility];  Service: General;  Laterality: N/A;   ORIF CLAVICLE FRACTURE Left 2000   PERINEAL LACERATION REPAIR  1980    Current Outpatient Medications  Medication Sig Dispense Refill   ADVAIR DISKUS 100-50 MCG/DOSE AEPB USE 1 PUFF EVERY 12 HOURS (Patient taking differently: Inhale 1 puff into the lungs in the morning and at bedtime.) 60 each 1   albuterol (PROVENTIL) (2.5 MG/3ML) 0.083% nebulizer solution Take 2.5 mg by nebulization every 6 (six) hours as needed.     ALBUTEROL IN Inhale into the lungs.     amoxicillin-clavulanate (AUGMENTIN) 875-125 MG tablet Take 1 tablet by mouth 2 (two) times daily.     azelastine (ASTELIN) 0.1 % nasal spray USE 1 TO  2 PUFFS IN EACH NOSTRIL ONCE TO TWICE DAILY AS NEEDED 30 mL 2   buPROPion (WELLBUTRIN XL) 300 MG 24 hr tablet Take 300 mg by mouth daily.     cetirizine (ZYRTEC) 10 MG tablet Take 10 mg by mouth every other day.     dicyclomine (BENTYL) 20 MG tablet Take 20 mg by mouth 3 (three) times daily as needed for spasms.   0   diphenhydrAMINE (SOMINEX) 25 MG tablet Take 25 mg by mouth at bedtime as needed for allergies.     EPINEPHrine 0.3 mg/0.3 mL IJ SOAJ injection Inject 0.3 mLs (0.3 mg total) into the muscle once. 1 Device 11   fexofenadine (ALLEGRA) 180 MG tablet Take 180 mg by mouth every other day.     fluticasone (FLONASE) 50 MCG/ACT nasal spray USE 1 TO 2 SPRAYS IN EACH NOSTRIL ONCE DAILY AT BEDTIME (Patient taking differently: Place 1-2 sprays into both nostrils at bedtime.) 16 g 1   levalbuterol (XOPENEX HFA) 45 MCG/ACT inhaler Inhale 2 puffs into the lungs as needed for wheezing.     montelukast (SINGULAIR) 10 MG tablet Take 10 mg by mouth daily.     Multiple Vitamins-Minerals (ONE-A-DAY WOMENS PO) Take 1 tablet by mouth daily.     ondansetron (ZOFRAN) 4 MG tablet Take 4 mg by mouth every 8 (eight) hours as needed for nausea or vomiting.     pantoprazole (PROTONIX) 40 MG tablet Take 40 mg by mouth every other day.  2   QUEtiapine (SEROQUEL) 25 MG tablet Take 1 tablet by mouth at bedtime as needed (sleep).   0   saccharomyces boulardii (FLORASTOR) 250 MG capsule See admin instructions.     zonisamide (ZONEGRAN) 25 MG capsule Take 50 mg by mouth at bedtime.     No current facility-administered medications for this visit.     ALLERGIES: Strawberry extract, Erythromycin, Sulfonamide derivatives, Tetracyclines & related, Doxycycline hyclate, Monistat [miconazole], Other, Adhesive [tape], Iodine, and Latex  Family History  Problem Relation Age of Onset   Gallbladder disease Father    Mental illness Mother    Hyperlipidemia Mother    Breast cancer Mother 8   Anesthesia problems Mother         hard to wake up post-op   Colon cancer Maternal Grandfather        d. 5   Leukemia Paternal Grandfather    Alzheimer's disease Paternal Grandfather    Brain cancer Paternal Uncle        benign brain tumor   Congestive Heart Failure Maternal Grandmother     Social History  Socioeconomic History   Marital status: Married    Spouse name: Not on file   Number of children: 0   Years of education: Not on file   Highest education level: Not on file  Occupational History   Occupation: raise horses    Employer: [employer]  Tobacco Use   Smoking status: Never   Smokeless tobacco: Never  Vaping Use   Vaping Use: Never used  Substance and Sexual Activity   Alcohol use: Not Currently    Alcohol/week: 0.0 standard drinks    Comment: rarely   Drug use: No   Sexual activity: Yes    Partners: Male    Birth control/protection: None  Other Topics Concern   Not on file  Social History Narrative   Not on file   Social Determinants of Health   Financial Resource Strain: Not on file  Food Insecurity: Not on file  Transportation Needs: Not on file  Physical Activity: Not on file  Stress: Not on file  Social Connections: Not on file  Intimate Partner Violence: Not on file    Review of Systems  Genitourinary:  Positive for menstrual problem (vaginal bleeding after physical activity--has IUD) and pelvic pain.  All other systems reviewed and are negative.  PHYSICAL EXAMINATION:    BP 120/76    Pulse 85    Ht 5' 3.5" (1.613 m)    Wt 157 lb (71.2 kg)    SpO2 98%    BMI 27.38 kg/m     General appearance: alert, cooperative and appears stated age   Pelvic: External genitalia:  no lesions              Urethra:  normal appearing urethra with no masses, tenderness or lesions              Bartholins and Skenes: normal                 Vagina: normal appearing vagina with normal color and discharge, no lesions              Cervix: no lesion.  IUD strings noted.  No CMT.                  Bimanual Exam:  Uterus:  normal size, contour, position, consistency, mobility, non-tender              Adnexa: no mass, fullness, tenderness.  Tenderness near left sigmoid colon.            Chaperone was present for exam:  Estill Bamberg, CMA  ASSESSMENT  Pelvic cramping.  Mirena IUD.  Gastroenteritis.   PLAN  Will have patient return for pelvic US.  We may do an exchange of her Mirena for a Kyleena IUD at that time. We discussed the benefits of progesterone IUDs and that the [facility] IUD is slightly small in size and can be used for 5 years.    An After Visit Summary was printed and given to the patient.  36 min  total time was spent for this patient encounter, including preparation, face-to-face counseling with the patient, coordination of care, and documentation of the encounter.

## 2021-11-05 ENCOUNTER — Other Ambulatory Visit: Payer: Self-pay

## 2021-11-05 ENCOUNTER — Ambulatory Visit (INDEPENDENT_AMBULATORY_CARE_PROVIDER_SITE_OTHER): Payer: Managed Care, Other (non HMO)

## 2021-11-05 ENCOUNTER — Other Ambulatory Visit: Payer: Managed Care, Other (non HMO) | Admitting: Obstetrics and Gynecology

## 2021-11-05 ENCOUNTER — Ambulatory Visit: Payer: Managed Care, Other (non HMO) | Admitting: Obstetrics and Gynecology

## 2021-11-05 ENCOUNTER — Other Ambulatory Visit: Payer: Managed Care, Other (non HMO)

## 2021-11-05 ENCOUNTER — Encounter: Payer: Self-pay | Admitting: Obstetrics and Gynecology

## 2021-11-05 VITALS — BP 122/76 | HR 90 | Ht 63.5 in | Wt 157.0 lb

## 2021-11-05 DIAGNOSIS — N83201 Unspecified ovarian cyst, right side: Secondary | ICD-10-CM

## 2021-11-05 DIAGNOSIS — Z30431 Encounter for routine checking of intrauterine contraceptive device: Secondary | ICD-10-CM

## 2021-11-05 DIAGNOSIS — N63 Unspecified lump in unspecified breast: Secondary | ICD-10-CM | POA: Diagnosis not present

## 2021-11-05 DIAGNOSIS — Z3009 Encounter for other general counseling and advice on contraception: Secondary | ICD-10-CM

## 2021-11-05 DIAGNOSIS — R102 Pelvic and perineal pain: Secondary | ICD-10-CM | POA: Diagnosis not present

## 2021-11-05 DIAGNOSIS — N83202 Unspecified ovarian cyst, left side: Secondary | ICD-10-CM

## 2021-11-05 NOTE — Progress Notes (Signed)
GYNECOLOGY  VISIT   HPI: 46 y.o.   Married  Caucasian  female   G1P0000 with No LMP recorded. (Menstrual status: IUD).   here for pelvic ultrasound due to pelvic cramping, onset after placement of the IUD in March, 2022.  Feels uncomfortable with increases in activity.   Pelvic US 01/08/21, normal uterus, normal IUD placement, and normal ovaries.   She does not have periods with her Mirena IUD.  Her cramping prior the receiving the IUD was debilitating along with diarrhea. She does not want to return to this.  Her migraines are also finally under control per patient.  Hx complex migraines with vision loss.   Has breast pain regularly.  Has back pain and hx of vertebral fracture.  States her back specialist has discussed potential breast reduction due to effect of her breasts on her back health.   GYNECOLOGIC HISTORY: No LMP recorded. (Menstrual status: IUD). Contraception:  Mirena 12-02-20  Menopausal hormone therapy:  none Last mammogram:   07-22-20 Diag.Bil.w/Rt.Br.US//Stable likely benign focal asymmetry in the central RIGHT breast and stable likely benign 5 mm mass in the UPPER RIGHT breast at the 12 o'clock position approximately 4 cm from the nipple/Lt.Br.Neg/Bil.Diag.&Rt.[place] in 1 year/BiRads3 Last pap smear:    05/25/17 Neg:Neg HR HPV, 02-02-13 Neg:Neg HR HPV, 01-20-10 Neg          OB History     Gravida  1   Para  0   Term  0   Preterm  0   AB  0   Living  0      SAB  0   IAB  0   Ectopic  0   Multiple  0   Live Births                 Patient Active Problem List   Diagnosis Date Noted   Genetic testing 07/27/2018   Family history of breast cancer    Family history of colon cancer    Rash and nonspecific skin eruption 07/04/2015   Chronic left maxillary sinusitis 01/04/2015   Allergic conjunctivitis 01/04/2015   Vertebral fracture 02/17/2014   Seasonal and perennial allergic rhinitis 06/30/2008   DEPRESSION 12/22/2007   Allergic-infective asthma  12/22/2007    Past Medical History:  Diagnosis Date   Allergy-induced asthma    prn inhaler   Cholecystitis 05/2015   Collar bone fracture    Complication of anesthesia    states unable to breathe when awoke from clavicle fx. surgery - had to be given neb. tx.   Depression    Environmental allergies    Family history of adverse reaction to anesthesia    states mother is hard to wake up post-op   Family history of breast cancer    Family history of colon cancer    Fibroid    Fibromyalgia    GERD (gastroesophageal reflux disease)    History of compression fracture of vertebral column 02/2014   T-12   History of COVID-19 09/26/2020   Migraines    with aura.    Past Surgical History:  Procedure Laterality Date   BREAST BIOPSY Right 2018   benign   BREAST SURGERY  2018   left breast bx - benign fibroadenoma   CHOLECYSTECTOMY N/A 05/21/2015   Procedure: LAPAROSCOPIC CHOLECYSTECTOMY WITH INTRAOPERATIVE CHOLANGIOGRAM;  Surgeon: Donnie Mesa, MD;  Location: [place];  Service: General;  Laterality: N/A;   LAPAROSCOPIC APPENDECTOMY  06/18/2012   Procedure: APPENDECTOMY LAPAROSCOPIC;  Surgeon:  Odis Hollingshead, MD;  Location: [facility];  Service: General;  Laterality: N/A;   ORIF CLAVICLE FRACTURE Left 2000   PERINEAL LACERATION REPAIR  1980    Current Outpatient Medications  Medication Sig Dispense Refill   ADVAIR DISKUS 100-50 MCG/DOSE AEPB USE 1 PUFF EVERY 12 HOURS (Patient taking differently: Inhale 1 puff into the lungs in the morning and at bedtime.) 60 each 1   albuterol (PROVENTIL) (2.5 MG/3ML) 0.083% nebulizer solution Take 2.5 mg by nebulization every 6 (six) hours as needed.     amoxicillin-clavulanate (AUGMENTIN) 875-125 MG tablet Take 1 tablet by mouth 2 (two) times daily.     azelastine (ASTELIN) 0.1 % nasal spray USE 1 TO 2 PUFFS IN EACH NOSTRIL ONCE TO TWICE DAILY AS NEEDED 30 mL 2   buPROPion (WELLBUTRIN XL) 300 MG 24 hr tablet Take 300 mg by mouth  daily.     cetirizine (ZYRTEC) 10 MG tablet Take 10 mg by mouth every other day.     dexlansoprazole (DEXILANT) 60 MG capsule Take by mouth.     dicyclomine (BENTYL) 20 MG tablet Take 20 mg by mouth 3 (three) times daily as needed for spasms.   0   diphenhydrAMINE (SOMINEX) 25 MG tablet Take 25 mg by mouth at bedtime as needed for allergies.     EPINEPHrine 0.3 mg/0.3 mL IJ SOAJ injection Inject 0.3 mLs (0.3 mg total) into the muscle once. 1 Device 11   fexofenadine (ALLEGRA) 180 MG tablet Take 180 mg by mouth every other day.     fluticasone (FLONASE) 50 MCG/ACT nasal spray USE 1 TO 2 SPRAYS IN EACH NOSTRIL ONCE DAILY AT BEDTIME (Patient taking differently: Place 1-2 sprays into both nostrils at bedtime.) 16 g 1   levalbuterol (XOPENEX HFA) 45 MCG/ACT inhaler Inhale 2 puffs into the lungs as needed for wheezing.     levocetirizine (XYZAL) 5 MG tablet levocetirizine 5 mg tablet     montelukast (SINGULAIR) 10 MG tablet Take 10 mg by mouth daily.     Multiple Vitamins-Minerals (ONE-A-DAY WOMENS PO) Take 1 tablet by mouth daily.     ondansetron (ZOFRAN) 4 MG tablet Take 4 mg by mouth every 8 (eight) hours as needed for nausea or vomiting.     ondansetron (ZOFRAN) 4 MG tablet Take by mouth.     QUEtiapine (SEROQUEL) 25 MG tablet Take 1 tablet by mouth at bedtime as needed (sleep).   0   saccharomyces boulardii (FLORASTOR) 250 MG capsule See admin instructions.     sucralfate (CARAFATE) 1 GM/10ML suspension Take 1 g by mouth 4 (four) times daily.     zonisamide (ZONEGRAN) 25 MG capsule Take 50 mg by mouth at bedtime.     No current facility-administered medications for this visit.     ALLERGIES: Strawberry extract, Erythromycin, Sulfonamide derivatives, Tetracyclines & related, Albuterol, Doxycycline hyclate, Monistat [miconazole], Other, Iodine, Latex, and Tape  Family History  Problem Relation Age of Onset   Gallbladder disease Father    Mental illness Mother    Hyperlipidemia Mother     Breast cancer Mother 17   Anesthesia problems Mother        hard to wake up post-op   Colon cancer Maternal Grandfather        d. 42   Leukemia Paternal Grandfather    Alzheimer's disease Paternal Grandfather    Brain cancer Paternal Uncle        benign brain tumor   Congestive Heart Failure Maternal Grandmother  Social History   Socioeconomic History   Marital status: Married    Spouse name: Not on file   Number of children: 0   Years of education: Not on file   Highest education level: Not on file  Occupational History   Occupation: raise horses    Employer: [employer]  Tobacco Use   Smoking status: Never   Smokeless tobacco: Never  Vaping Use   Vaping Use: Never used  Substance and Sexual Activity   Alcohol use: Not Currently    Alcohol/week: 0.0 standard drinks    Comment: rarely   Drug use: No   Sexual activity: Yes    Partners: Male    Birth control/protection: None  Other Topics Concern   Not on file  Social History Narrative   Not on file   Social Determinants of Health   Financial Resource Strain: Not on file  Food Insecurity: Not on file  Transportation Needs: Not on file  Physical Activity: Not on file  Stress: Not on file  Social Connections: Not on file  Intimate Partner Violence: Not on file    Review of Systems  All other systems reviewed and are negative.  PHYSICAL EXAMINATION:    BP 122/76    Pulse 90    Ht 5' 3.5" (1.613 m)    Wt 157 lb (71.2 kg)    SpO2 98%    BMI 27.38 kg/m     General appearance: alert, cooperative and appears stated age  Pelvic US  Uterus 7.73 x 5.17 x 3.73 cm.  IUD in normal position with both arms extended. No myometrial masses.  EMS 3.32 mm. Right ovary with avascular cyst 2.0 x 1.9 cm.  Left ovary with avascular cyst 3.1 x 2.7 cm.  No free fluid.  ASSESSMENT  IUD check up.  IUD in normal position.  Small bilateral simple ovarian cysts.  Pelvic cramping with IUD.  Hx significant  dysmenorrhea prior to IUD placement.  Complex migraines.  Breast swelling and heaviness.  PLAN  Pelvic US findings and report reviewed.  Reassurance regarding IUD position and benign nature of the cysts, which do not need follow up.  We reviewed risks and benefits of removing the IUD and switching to a smaller IUD with less progesterone.  Patient will continue with IUD for now.  We discussed consultation with a plastic surgeon about potential breast reduction.   Fu for annual exam and prn.   An After Visit Summary was printed and given to the patient.  25 min  total time was spent for this patient encounter, including preparation, face-to-face counseling with the patient, coordination of care, and documentation of the encounter.

## 2021-12-01 ENCOUNTER — Other Ambulatory Visit: Payer: Self-pay | Admitting: Obstetrics and Gynecology

## 2021-12-01 DIAGNOSIS — N6489 Other specified disorders of breast: Secondary | ICD-10-CM

## 2021-12-01 DIAGNOSIS — Z1231 Encounter for screening mammogram for malignant neoplasm of breast: Secondary | ICD-10-CM

## 2021-12-17 ENCOUNTER — Ambulatory Visit
Admission: RE | Admit: 2021-12-17 | Discharge: 2021-12-17 | Disposition: A | Discharge status: Home / Self Care | Payer: Managed Care, Other (non HMO) | Source: Ambulatory Visit | Attending: Internal Medicine | Admitting: Internal Medicine

## 2021-12-17 ENCOUNTER — Other Ambulatory Visit: Payer: Self-pay | Admitting: Internal Medicine

## 2021-12-17 DIAGNOSIS — M25562 Pain in left knee: Secondary | ICD-10-CM

## 2021-12-25 ENCOUNTER — Ambulatory Visit: Payer: Managed Care, Other (non HMO)

## 2021-12-25 ENCOUNTER — Ambulatory Visit: Admission: RE | Admit: 2021-12-25 | Payer: Managed Care, Other (non HMO) | Source: Ambulatory Visit

## 2021-12-25 ENCOUNTER — Ambulatory Visit
Admission: RE | Admit: 2021-12-25 | Discharge: 2021-12-25 | Disposition: A | Discharge status: Home / Self Care | Payer: Managed Care, Other (non HMO) | Source: Ambulatory Visit | Attending: Obstetrics and Gynecology | Admitting: Obstetrics and Gynecology

## 2021-12-25 DIAGNOSIS — Z1231 Encounter for screening mammogram for malignant neoplasm of breast: Secondary | ICD-10-CM

## 2021-12-25 DIAGNOSIS — N6489 Other specified disorders of breast: Secondary | ICD-10-CM

## 2022-11-25 ENCOUNTER — Ambulatory Visit: Payer: Managed Care, Other (non HMO) | Admitting: Obstetrics and Gynecology

## 2022-11-25 NOTE — Progress Notes (Deleted)
GYNECOLOGY  VISIT   HPI: 47 y.o.   Married  Caucasian  female   G1P0000 with No LMP recorded. (Menstrual status: IUD).   here for   pelvic pain and cramping  GYNECOLOGIC HISTORY: No LMP recorded. (Menstrual status: IUD). Contraception:  IUD--Mirena 12/02/20 Menopausal hormone therapy:  n/a Last mammogram:  12/25/21 Breast Density Category B, BI-RADS CAT 2 benign Last pap smear:   05/25/17 neg: HR HPV neg, 02/02/13 neg        OB History     Gravida  1   Para  0   Term  0   Preterm  0   AB  0   Living  0      SAB  0   IAB  0   Ectopic  0   Multiple  0   Live Births                 Patient Active Problem List   Diagnosis Date Noted   Genetic testing 07/27/2018   Family history of breast cancer    Family history of colon cancer    Rash and nonspecific skin eruption 07/04/2015   Chronic left maxillary sinusitis 01/04/2015   Allergic conjunctivitis 01/04/2015   Vertebral fracture 02/17/2014   Seasonal and perennial allergic rhinitis 06/30/2008   DEPRESSION 12/22/2007   Allergic-infective asthma 12/22/2007    Past Medical History:  Diagnosis Date   Allergy-induced asthma    prn inhaler   Cholecystitis 05/2015   Collar bone fracture    Complication of anesthesia    states unable to breathe when awoke from clavicle fx. surgery - had to be given neb. tx.   Depression    Environmental allergies    Family history of adverse reaction to anesthesia    states mother is hard to wake up post-op   Family history of breast cancer    Family history of colon cancer    Fibroid    Fibromyalgia    GERD (gastroesophageal reflux disease)    History of compression fracture of vertebral column 02/2014   T-12   History of COVID-19 09/26/2020   Migraines    with aura.    Past Surgical History:  Procedure Laterality Date   BREAST BIOPSY Right 2018   benign   BREAST SURGERY  2018   left breast bx - benign fibroadenoma   CHOLECYSTECTOMY N/A 05/21/2015   Procedure:  LAPAROSCOPIC CHOLECYSTECTOMY WITH INTRAOPERATIVE CHOLANGIOGRAM;  Surgeon: Donnie Mesa, MD;  Location: [place];  Service: General;  Laterality: N/A;   LAPAROSCOPIC APPENDECTOMY  06/18/2012   Procedure: APPENDECTOMY LAPAROSCOPIC;  Surgeon: Odis Hollingshead, MD;  Location: [facility];  Service: General;  Laterality: N/A;   ORIF CLAVICLE FRACTURE Left 2000   PERINEAL LACERATION REPAIR  1980    Current Outpatient Medications  Medication Sig Dispense Refill   ADVAIR DISKUS 100-50 MCG/DOSE AEPB USE 1 PUFF EVERY 12 HOURS (Patient taking differently: Inhale 1 puff into the lungs in the morning and at bedtime.) 60 each 1   albuterol (PROVENTIL) (2.5 MG/3ML) 0.083% nebulizer solution Take 2.5 mg by nebulization every 6 (six) hours as needed.     amoxicillin-clavulanate (AUGMENTIN) 875-125 MG tablet Take 1 tablet by mouth 2 (two) times daily.     azelastine (ASTELIN) 0.1 % nasal spray USE 1 TO 2 PUFFS IN EACH NOSTRIL ONCE TO TWICE DAILY AS NEEDED 30 mL 2   buPROPion (WELLBUTRIN XL) 300 MG 24 hr tablet Take 300 mg by mouth  daily.     cetirizine (ZYRTEC) 10 MG tablet Take 10 mg by mouth every other day.     dexlansoprazole (DEXILANT) 60 MG capsule Take by mouth.     dicyclomine (BENTYL) 20 MG tablet Take 20 mg by mouth 3 (three) times daily as needed for spasms.   0   diphenhydrAMINE (SOMINEX) 25 MG tablet Take 25 mg by mouth at bedtime as needed for allergies.     EPINEPHrine 0.3 mg/0.3 mL IJ SOAJ injection Inject 0.3 mLs (0.3 mg total) into the muscle once. 1 Device 11   fexofenadine (ALLEGRA) 180 MG tablet Take 180 mg by mouth every other day.     fluticasone (FLONASE) 50 MCG/ACT nasal spray USE 1 TO 2 SPRAYS IN EACH NOSTRIL ONCE DAILY AT BEDTIME (Patient taking differently: Place 1-2 sprays into both nostrils at bedtime.) 16 g 1   levalbuterol (XOPENEX HFA) 45 MCG/ACT inhaler Inhale 2 puffs into the lungs as needed for wheezing.     levocetirizine (XYZAL) 5 MG tablet levocetirizine 5 mg  tablet     montelukast (SINGULAIR) 10 MG tablet Take 10 mg by mouth daily.     Multiple Vitamins-Minerals (ONE-A-DAY WOMENS PO) Take 1 tablet by mouth daily.     ondansetron (ZOFRAN) 4 MG tablet Take 4 mg by mouth every 8 (eight) hours as needed for nausea or vomiting.     ondansetron (ZOFRAN) 4 MG tablet Take by mouth.     QUEtiapine (SEROQUEL) 25 MG tablet Take 1 tablet by mouth at bedtime as needed (sleep).   0   saccharomyces boulardii (FLORASTOR) 250 MG capsule See admin instructions.     sucralfate (CARAFATE) 1 GM/10ML suspension Take 1 g by mouth 4 (four) times daily.     zonisamide (ZONEGRAN) 25 MG capsule Take 50 mg by mouth at bedtime.     No current facility-administered medications for this visit.     ALLERGIES: Strawberry extract, Erythromycin, Sulfonamide derivatives, Tetracyclines & related, Albuterol, Doxycycline hyclate, Monistat [miconazole], Other, Iodine, Latex, and Tape  Family History  Problem Relation Age of Onset   Gallbladder disease Father    Mental illness Mother    Hyperlipidemia Mother    Breast cancer Mother 90   Anesthesia problems Mother        hard to wake up post-op   Colon cancer Maternal Grandfather        d. 63   Leukemia Paternal Grandfather    Alzheimer's disease Paternal Grandfather    Brain cancer Paternal Uncle        benign brain tumor   Congestive Heart Failure Maternal Grandmother     Social History   Socioeconomic History   Marital status: Married    Spouse name: Not on file   Number of children: 0   Years of education: Not on file   Highest education level: Not on file  Occupational History   Occupation: raise horses    Employer: [employer]  Tobacco Use   Smoking status: Never   Smokeless tobacco: Never  Vaping Use   Vaping Use: Never used  Substance and Sexual Activity   Alcohol use: Not Currently    Alcohol/week: 0.0 standard drinks of alcohol    Comment: rarely   Drug use: No   Sexual activity: Yes     Partners: Male    Birth control/protection: None  Other Topics Concern   Not on file  Social History Narrative   Not on file   Social Determinants of Health  Financial Resource Strain: Not on file  Food Insecurity: Not on file  Transportation Needs: Not on file  Physical Activity: Not on file  Stress: Not on file  Social Connections: Not on file  Intimate Partner Violence: Not on file    Review of Systems  PHYSICAL EXAMINATION:    There were no vitals taken for this visit.    General appearance: alert, cooperative and appears stated age Head: Normocephalic, without obvious abnormality, atraumatic Neck: no adenopathy, supple, symmetrical, trachea midline and thyroid normal to inspection and palpation Lungs: clear to auscultation bilaterally Breasts: normal appearance, no masses or tenderness, No nipple retraction or dimpling, No nipple discharge or bleeding, No axillary or supraclavicular adenopathy Heart: regular rate and rhythm Abdomen: soft, non-tender, no masses,  no organomegaly Extremities: extremities normal, atraumatic, no cyanosis or edema Skin: Skin color, texture, turgor normal. No rashes or lesions Lymph nodes: Cervical, supraclavicular, and axillary nodes normal. No abnormal inguinal nodes palpated Neurologic: Grossly normal  Pelvic: External genitalia:  no lesions              Urethra:  normal appearing urethra with no masses, tenderness or lesions              Bartholins and Skenes: normal                 Vagina: normal appearing vagina with normal color and discharge, no lesions              Cervix: no lesions                Bimanual Exam:  Uterus:  normal size, contour, position, consistency, mobility, non-tender              Adnexa: no mass, fullness, tenderness              Rectal exam: {yes no:314532}.  Confirms.              Anus:  normal sphincter tone, no lesions  Chaperone was present for exam:  ***  ASSESSMENT     PLAN     An After  Visit Summary was printed and given to the patient.  ______ minutes face to face time of which over 50% was spent in counseling.

## 2022-12-22 NOTE — Progress Notes (Signed)
47 y.o. G76P0000 Married Caucasian female here for annual exam.    Having light menstrual bleeding, 2 - 3 days of painful bleeding and comes and goes.  Having LLQ pain throughout the month, that increases with physical activity.  Has cramping throughout the month since her IUD was placed.  She had a normal pelvic US 11/05/21 showing her IUD in a normal position.   She really wants her IUD removed today.   No change in sexual partner.  Neg GC/CT/trich 01/08/21.   PCP:   Dr. Chales Salmon  No LMP recorded. (Menstrual status: IUD).    Spotting off and on for weeks       Sexually active: Yes.    The current method of family planning is IUD.   Mirena 12/02/20. Exercising: Yes.     Hiking and barre classes Smoker:  no  Health Maintenance: Pap:  05/25/17 neg: HR HPV neg, 02-02-13 Neg:Neg HR HPV, 01-20-10 Neg   History of abnormal Pap:  no MMG:  12/25/21 Breast Density Cat B, BI-RADS CAT 2 benign Colonoscopy:  10/27/17--due in 5 years.  She will schedule.  BMD:   n/a  Result  n/a TDaP:  09/09/15 Gardasil:   no HIV: n/a Hep C: n/a Screening Labs:  PCP   reports that she has never smoked. She has never used smokeless tobacco. She reports that she does not currently use alcohol. She reports that she does not use drugs.  Past Medical History:  Diagnosis Date   Allergy-induced asthma    prn inhaler   Cholecystitis 05/2015   Collar bone fracture    Complication of anesthesia    states unable to breathe when awoke from clavicle fx. surgery - had to be given neb. tx.   Depression    Environmental allergies    Family history of adverse reaction to anesthesia    states mother is hard to wake up post-op   Family history of breast cancer    Family history of colon cancer    Fibroid    Fibromyalgia    GERD (gastroesophageal reflux disease)    History of compression fracture of vertebral column 02/2014   T-12   History of COVID-19 09/26/2020   Migraines    with aura.    Past Surgical History:   Procedure Laterality Date   BREAST BIOPSY Right 2018   benign   BREAST SURGERY  2018   left breast bx - benign fibroadenoma   CHOLECYSTECTOMY N/A 05/21/2015   Procedure: LAPAROSCOPIC CHOLECYSTECTOMY WITH INTRAOPERATIVE CHOLANGIOGRAM;  Surgeon: Manus Rudd, MD;  Location: [facility];  Service: General;  Laterality: N/A;   LAPAROSCOPIC APPENDECTOMY  06/18/2012   Procedure: APPENDECTOMY LAPAROSCOPIC;  Surgeon: Adolph Pollack, MD;  Location: [facility];  Service: General;  Laterality: N/A;   ORIF CLAVICLE FRACTURE Left 2000   PERINEAL LACERATION REPAIR  1980    Current Outpatient Medications  Medication Sig Dispense Refill   ADVAIR DISKUS 100-50 MCG/DOSE AEPB USE 1 PUFF EVERY 12 HOURS (Patient taking differently: Inhale 1 puff into the lungs in the morning and at bedtime.) 60 each 1   albuterol (PROVENTIL) (2.5 MG/3ML) 0.083% nebulizer solution Take 2.5 mg by nebulization every 6 (six) hours as needed.     azelastine (ASTELIN) 0.1 % nasal spray USE 1 TO 2 PUFFS IN EACH NOSTRIL ONCE TO TWICE DAILY AS NEEDED 30 mL 2   buPROPion (WELLBUTRIN XL) 300 MG 24 hr tablet Take 300 mg by mouth daily.     cetirizine (  ZYRTEC) 10 MG tablet Take 10 mg by mouth every other day.     dicyclomine (BENTYL) 20 MG tablet Take 20 mg by mouth 3 (three) times daily as needed for spasms.   0   diphenhydrAMINE (SOMINEX) 25 MG tablet Take 25 mg by mouth at bedtime as needed for allergies.     EPINEPHrine 0.3 mg/0.3 mL IJ SOAJ injection Inject 0.3 mLs (0.3 mg total) into the muscle once. 1 Device 11   fexofenadine (ALLEGRA) 180 MG tablet Take 180 mg by mouth every other day.     fluticasone (FLONASE) 50 MCG/ACT nasal spray USE 1 TO 2 SPRAYS IN EACH NOSTRIL ONCE DAILY AT BEDTIME (Patient taking differently: Place 1-2 sprays into both nostrils at bedtime.) 16 g 1   levalbuterol (XOPENEX HFA) 45 MCG/ACT inhaler Inhale 2 puffs into the lungs as needed for wheezing.     levocetirizine (XYZAL) 5 MG tablet  levocetirizine 5 mg tablet     montelukast (SINGULAIR) 10 MG tablet Take 10 mg by mouth daily.     Multiple Vitamins-Minerals (ONE-A-DAY WOMENS PO) Take 1 tablet by mouth daily.     Omeprazole Magnesium (PRILOSEC PO)      ondansetron (ZOFRAN) 4 MG tablet Take 4 mg by mouth every 8 (eight) hours as needed for nausea or vomiting.     ondansetron (ZOFRAN) 4 MG tablet Take by mouth.     QUEtiapine (SEROQUEL) 25 MG tablet Take 1 tablet by mouth at bedtime as needed (sleep).   0   saccharomyces boulardii (FLORASTOR) 250 MG capsule See admin instructions.     sucralfate (CARAFATE) 1 GM/10ML suspension Take 1 g by mouth 4 (four) times daily.     topiramate (TOPAMAX) 100 MG tablet Take 1 tablet by mouth daily.     No current facility-administered medications for this visit.    Family History  Problem Relation Age of Onset   Gallbladder disease Father    Mental illness Mother    Hyperlipidemia Mother    Breast cancer Mother 4   Anesthesia problems Mother        hard to wake up post-op   Colon cancer Maternal Grandfather        d. 69   Leukemia Paternal Grandfather    Alzheimer's disease Paternal Grandfather    Brain cancer Paternal Uncle        benign brain tumor   Congestive Heart Failure Maternal Grandmother     Review of Systems  All other systems reviewed and are negative.   Exam:   BP 122/74 (BP Location: Right Arm, Patient Position: Sitting, Cuff Size: Normal)   Pulse 70   Ht 5' 4.5" (1.638 m)   Wt 157 lb (71.2 kg)   SpO2 99%   BMI 26.53 kg/m     General appearance: alert, cooperative and appears stated age Head: normocephalic, without obvious abnormality, atraumatic Neck: no adenopathy, supple, symmetrical, trachea midline and thyroid normal to inspection and palpation Lungs: clear to auscultation bilaterally Breasts: normal appearance, no masses or tenderness, No nipple retraction or dimpling, No nipple discharge or bleeding, No axillary adenopathy Heart: regular rate  and rhythm Abdomen: soft, non-tender; no masses, no organomegaly Extremities: extremities normal, atraumatic, no cyanosis or edema Skin: skin color, texture, turgor normal. No rashes or lesions Lymph nodes: cervical, supraclavicular, and axillary nodes normal. Neurologic: grossly normal  Pelvic: External genitalia:  no lesions              No abnormal inguinal nodes palpated.  Urethra:  normal appearing urethra with no masses, tenderness or lesions              Bartholins and Skenes: normal                 Vagina: normal appearing vagina with normal color and discharge, no lesions              Cervix: no lesions.  IUD strings noted.               Pap taken: yes Bimanual Exam:  Uterus:  normal size, contour, position, consistency, mobility, non-tender              Adnexa: no mass, fullness, tenderness              Rectal exam: yes.  Confirms.              Anus:  normal sphincter tone, no lesions  Consent for IUD removal.  Consent signed.  Ring forceps used to remove IUD, intact, shown to patient, and discarded.  Chaperone was present for exam:  Warren Lacy, CMA  Assessment:   Well woman visit with gynecologic exam. Mirena IUD. Migraine with aura. On Topamax 100 mg.  Mother with breast cancer age 49.   Uncertain family history.   Negative genetic testing.  Status post left breast biopsy - fibroadenoma.  Hx fibroid.  10 mm fundal.   Plan: Mammogram screening discussed.  She will schedule.  Self breast awareness reviewed. Pap and HR HPV collected. Guidelines for Calcium, Vitamin D, regular exercise program including cardiovascular and weight bearing exercise. Mirena removed. Start Micronor.  Instructed in use.  If she desires an IUD in the future, consider Kyleena.  Follow up annually and prn.   After visit summary provided.

## 2023-01-05 ENCOUNTER — Ambulatory Visit (INDEPENDENT_AMBULATORY_CARE_PROVIDER_SITE_OTHER): Payer: 59 | Admitting: Obstetrics and Gynecology

## 2023-01-05 ENCOUNTER — Other Ambulatory Visit (HOSPITAL_COMMUNITY)
Admission: RE | Admit: 2023-01-05 | Discharge: 2023-01-05 | Disposition: A | Discharge status: Home / Self Care | Payer: 59 | Source: Ambulatory Visit | Attending: Obstetrics and Gynecology | Admitting: Obstetrics and Gynecology

## 2023-01-05 ENCOUNTER — Encounter: Payer: Self-pay | Admitting: Obstetrics and Gynecology

## 2023-01-05 VITALS — BP 122/74 | HR 70 | Ht 64.5 in | Wt 157.0 lb

## 2023-01-05 DIAGNOSIS — Z30432 Encounter for removal of intrauterine contraceptive device: Secondary | ICD-10-CM

## 2023-01-05 DIAGNOSIS — Z124 Encounter for screening for malignant neoplasm of cervix: Secondary | ICD-10-CM | POA: Diagnosis present

## 2023-01-05 DIAGNOSIS — Z01419 Encounter for gynecological examination (general) (routine) without abnormal findings: Secondary | ICD-10-CM | POA: Diagnosis not present

## 2023-01-05 MED ORDER — NORETHINDRONE 0.35 MG PO TABS: 1.0000 | ORAL_TABLET | Freq: Every day | ORAL | 3 refills | Status: DC

## 2023-01-05 NOTE — Patient Instructions (Signed)
EXERCISE AND DIET:  We recommended that you start or continue a regular exercise program for good health. Regular exercise means any activity that makes your heart beat faster and makes you sweat.  We recommend exercising at least 30 minutes per day at least 3 days a week, preferably 4 or 5.  We also recommend a diet low in fat and sugar.  Inactivity, poor dietary choices and obesity can cause diabetes, heart attack, stroke, and kidney damage, among others.    ALCOHOL AND SMOKING:  Women should limit their alcohol intake to no more than 7 drinks/beers/glasses of wine (combined, not each!) per week. Moderation of alcohol intake to this level decreases your risk of breast cancer and liver damage. And of course, no recreational drugs are part of a healthy lifestyle.  And absolutely no smoking or even second hand smoke. Most people know smoking can cause heart and lung diseases, but did you know it also contributes to weakening of your bones? Aging of your skin?  Yellowing of your teeth and nails?  CALCIUM AND VITAMIN D:  Adequate intake of calcium and Vitamin D are recommended.  The recommendations for exact amounts of these supplements seem to change often, but generally speaking 600 mg of calcium (either carbonate or citrate) and 800 units of Vitamin D per day seems prudent. Certain women may benefit from higher intake of Vitamin D.  If you are among these women, your doctor will have told you during your visit.    PAP SMEARS:  Pap smears, to check for cervical cancer or precancers,  have traditionally been done yearly, although recent scientific advances have shown that most women can have pap smears less often.  However, every woman still should have a physical exam from her gynecologist every year. It will include a breast check, inspection of the vulva and vagina to check for abnormal growths or skin changes, a visual exam of the cervix, and then an exam to evaluate the size and shape of the uterus and  ovaries.  And after 46 years of age, a rectal exam is indicated to check for rectal cancers. We will also provide age appropriate advice regarding health maintenance, like when you should have certain vaccines, screening for sexually transmitted diseases, bone density testing, colonoscopy, mammograms, etc.   MAMMOGRAMS:  All women over 40 years old should have a yearly mammogram. Many facilities now offer a "3D" mammogram, which may cost around $50 extra out of pocket. If possible,  we recommend you accept the option to have the 3D mammogram performed.  It both reduces the number of women who will be called back for extra views which then turn out to be normal, and it is better than the routine mammogram at detecting truly abnormal areas.    COLONOSCOPY:  Colonoscopy to screen for colon cancer is recommended for all women at age 50.  We know, you hate the idea of the prep.  We agree, BUT, having colon cancer and not knowing it is worse!!  Colon cancer so often starts as a polyp that can be seen and removed at colonscopy, which can quite literally save your life!  And if your first colonoscopy is normal and you have no family history of colon cancer, most women don't have to have it again for 10 years.  Once every ten years, you can do something that may end up saving your life, right?  We will be happy to help you get it scheduled when you are ready.    Be sure to check your insurance coverage so you understand how much it will cost.  It may be covered as a preventative service at no cost, but you should check your particular policy.    Norethindrone Tablets (Contraception) What is this medication? NORETHINDRONE (nor eth IN drone) prevents ovulation and pregnancy. It belongs to a group of medications called contraceptives. This medication is a progestin hormone. This medicine may be used for other purposes; ask your health care provider or pharmacist if you have questions. COMMON BRAND NAME(S): Camila,  Deblitane 28-Day, Errin, Heather, Jencycla, Jolivette, Lyza, Nor-QD, Nora-BE, Norlyroc, Ortho Micronor, Sharobel 28-Day What should I tell my care team before I take this medication? They need to know if you have any of these conditions: Blood vessel disease or blood clots Breast, cervical, or vaginal cancer Diabetes Heart disease Kidney disease Liver disease Mental depression Migraine Seizures Stroke Vaginal bleeding An unusual or allergic reaction to norethindrone, other medications, foods, dyes, or preservatives Pregnant or trying to get pregnant Breast-feeding How should I use this medication? Take this medication by mouth with a glass of water. You may take it with or without food. Follow the directions on the prescription label. Take this medication at the same time each day and in the order directed on the package. Do not take your medication more often than directed. Contact your care team about the use of this medication in children. Special care may be needed. This medication has been used in female children who have started having menstrual periods. A patient package insert for the product will be given with each prescription and refill. Read this sheet carefully each time. The sheet may change frequently. Overdosage: If you think you have taken too much of this medicine contact a poison control center or emergency room at once. NOTE: This medicine is only for you. Do not share this medicine with others. What if I miss a dose? Try not to miss a dose. Every time you miss a dose or take a dose late your chance of pregnancy increases. When 1 pill is missed (even if only 3 hours late), take the missed pill as soon as possible and continue taking a pill each day at the regular time (use a back-up method of birth control for the next 48 hours). If more than 1 dose is missed, use an additional birth control method for the rest of your pill pack until menses occurs. Contact your care team  if more than 1 dose has been missed. What may interact with this medication? Do not take this medication with any of the following: Amprenavir or fosamprenavir Bosentan This medication may also interact with the following: Antibiotics or medications for infections, especially rifampin, rifabutin, rifapentine, and griseofulvin, and possibly penicillins or tetracyclines Aprepitant Barbiturate medications, such as phenobarbital Carbamazepine Felbamate Modafinil Oxcarbazepine Phenytoin Ritonavir or other medications for HIV infection or AIDS St. John's wort Topiramate This list may not describe all possible interactions. Give your health care provider a list of all the medicines, herbs, non-prescription drugs, or dietary supplements you use. Also tell them if you smoke, drink alcohol, or use illegal drugs. Some items may interact with your medicine. What should I watch for while using this medication? Visit your care team for regular checks on your progress. You will need a regular breast and pelvic exam and Pap smear while on this medication. Use an additional method of birth control during the first cycle that you take these tablets. If you have any reason   to think you are pregnant, stop taking this medication right away and contact your care team. If you are taking this medication for hormone related problems, it may take several cycles of use to see improvement in your condition. This medication does not protect you against HIV infection (AIDS) or any other sexually transmitted diseases. What side effects may I notice from receiving this medication? Side effects that you should report to your care team as soon as possible: Allergic reactions--skin rash, itching, hives, swelling of the face, lips, tongue, or throat Blood clot--pain, swelling, or warmth in the leg, shortness of breath, chest pain Gallbladder problems--severe stomach pain, nausea, vomiting, fever Increase in blood  pressure Liver injury--right upper belly pain, loss of appetite, nausea, light-colored stool, dark yellow or brown urine, yellowing skin or eyes, unusual weakness or fatigue New or worsening migraines or headaches Stroke--sudden numbness or weakness of the face, arm, or leg, trouble speaking, confusion, trouble walking, loss of balance or coordination, dizziness, severe headache, change in vision Unusual vaginal discharge, itching, or odor Worsening mood, feelings of depression Side effects that usually do not require medical attention (report to your care team if they continue or are bothersome): Breast pain or tenderness Dark patches of skin on the face or other sun-exposed areas Irregular menstrual cycles or spotting Nausea Weight gain This list may not describe all possible side effects. Call your doctor for medical advice about side effects. You may report side effects to FDA at 1-800-FDA-1088. Where should I keep my medication? Keep out of the reach of children and pets. Store at room temperature between 15 and 30 degrees C (59 and 86 degrees F). Throw away any unused medication after the expiration date. NOTE: This sheet is a summary. It may not cover all possible information. If you have questions about this medicine, talk to your doctor, pharmacist, or health care provider.  2023 Elsevier/Gold Standard (2020-10-26 00:00:00)  

## 2023-01-06 LAB — CYTOLOGY - PAP
Comment: NEGATIVE
Diagnosis: NEGATIVE
High risk HPV: NEGATIVE

## 2023-10-04 ENCOUNTER — Other Ambulatory Visit: Payer: Self-pay | Admitting: Obstetrics and Gynecology

## 2023-10-05 ENCOUNTER — Encounter: Payer: Self-pay | Admitting: Obstetrics and Gynecology

## 2023-10-05 NOTE — Telephone Encounter (Signed)
Patient sent mychart message requesting refill of buproprion 300mg  Last AEX:  01-05-23 Next AEX: none Last MMG (if hormonal medication request): n/a Refill authorized: please approve or deny as appropriate

## 2023-10-05 NOTE — Telephone Encounter (Signed)
Medication refill request: micronor Last AEX:  01-05-23 Next AEX: not scheduled Last MMG (if hormonal medication request): 12-25-21 bilateral & rt breast 12-25-21 birads 2:neg Refill authorized: rx was sent at [facility] for [facility]yr. Should not need a new rx. This rx denied

## 2023-12-16 ENCOUNTER — Other Ambulatory Visit: Payer: Self-pay | Admitting: Obstetrics and Gynecology

## 2023-12-16 ENCOUNTER — Encounter: Payer: Self-pay | Admitting: Obstetrics and Gynecology

## 2023-12-16 NOTE — Telephone Encounter (Signed)
 Med refill request: micronor  Last AEX: 01/05/23 Next AEX: not scheduled  Last MMG (if hormonal med) 12/25/21 birads cat 2 benign  Refill authorized: last rx 01/05/23 #84 with 3 refills. Please approve or deny

## 2023-12-16 NOTE — Telephone Encounter (Signed)
 Refer to rx refill request dated today also for additional communication with the pt. Encounter closed.

## 2024-01-06 ENCOUNTER — Other Ambulatory Visit: Payer: Self-pay | Admitting: Obstetrics and Gynecology

## 2024-01-06 NOTE — Telephone Encounter (Signed)
 Medication refill request: norethindrone  0.35mg  Last AEX:  01-05-23 Next AEX: 06-20-24 Last MMG (if hormonal medication request): 12-25-21 bilateral & rt breast u/s birads 2:neg Refill authorized: Pt is requesting a 90 day supply. Please approve if appropriate

## 2024-01-10 ENCOUNTER — Other Ambulatory Visit: Payer: Self-pay | Admitting: Obstetrics and Gynecology

## 2024-01-10 DIAGNOSIS — Z1231 Encounter for screening mammogram for malignant neoplasm of breast: Secondary | ICD-10-CM

## 2024-01-10 NOTE — Telephone Encounter (Signed)
 Patient notified & agrees to get her mmg done. She will let us  know when she has done it.

## 2024-01-20 ENCOUNTER — Other Ambulatory Visit: Payer: Self-pay | Admitting: Medical Genetics

## 2024-01-27 ENCOUNTER — Ambulatory Visit
Admission: RE | Admit: 2024-01-27 | Discharge: 2024-01-27 | Disposition: A | Discharge status: Home / Self Care | Source: Ambulatory Visit | Attending: Obstetrics and Gynecology | Admitting: Obstetrics and Gynecology

## 2024-01-27 DIAGNOSIS — Z1231 Encounter for screening mammogram for malignant neoplasm of breast: Secondary | ICD-10-CM

## 2024-01-30 ENCOUNTER — Other Ambulatory Visit: Payer: Self-pay | Admitting: Obstetrics and Gynecology

## 2024-01-31 NOTE — Telephone Encounter (Signed)
 Med refill request: Micronor   Last AEX: 01/05/23  Next AEX: 06/20/24 Last MMG (if hormonal med) 01/27/24 not resulted yet  Refill authorized: Last rx 01/06/24 #28 with 0 refills. Requesting that a 90 day supply be sent in. Please advise.

## 2024-02-01 ENCOUNTER — Ambulatory Visit: Payer: Self-pay | Admitting: Obstetrics and Gynecology

## 2024-06-20 ENCOUNTER — Ambulatory Visit (INDEPENDENT_AMBULATORY_CARE_PROVIDER_SITE_OTHER): Admitting: Obstetrics and Gynecology

## 2024-06-20 ENCOUNTER — Encounter: Payer: Self-pay | Admitting: Obstetrics and Gynecology

## 2024-06-20 VITALS — BP 116/74 | HR 81 | Ht 64.5 in | Wt 160.0 lb

## 2024-06-20 DIAGNOSIS — Z01419 Encounter for gynecological examination (general) (routine) without abnormal findings: Secondary | ICD-10-CM

## 2024-06-20 DIAGNOSIS — L659 Nonscarring hair loss, unspecified: Secondary | ICD-10-CM

## 2024-06-20 DIAGNOSIS — Z1331 Encounter for screening for depression: Secondary | ICD-10-CM | POA: Diagnosis not present

## 2024-06-20 MED ORDER — NORETHINDRONE 0.35 MG PO TABS: 1.0000 | ORAL_TABLET | Freq: Every day | ORAL | 3 refills | Status: AC

## 2024-06-20 NOTE — Progress Notes (Unsigned)
 48 y.o. G53P0000 Married Caucasian female here for annual exam.    Hard to loose weight.  Losing her hair.  Skin it itching.    Taking Micronor . Her headaches have improved on this.   Lost her job earlier this year.    PCP: Elliot Charm, MD   Patient's last menstrual period was 06/06/2024 (approximate).     Period Cycle (Days): 28 Period Duration (Days): 5-9 Period Pattern: Regular Menstrual Flow:  (Varies) Menstrual Control: Maxi pad, Tampon Dysmenorrhea: (!) Moderate Dysmenorrhea Symptoms: Cramping     Sexually active: Yes.    The current method of family planning is POP.  Menopausal hormone therapy:  n/a Exercising: Yes.    Swimming 3x week and hiking  Smoker:  no  OB History  Gravida Para Term Preterm AB Living  1 0 0 0 0 0  SAB IAB Ectopic Multiple Live Births  0 0 0 0     # Outcome Date GA Lbr Len/2nd Weight Sex Type Anes PTL Lv  1 Gravida              HEALTH MAINTENANCE: Last 2 paps:  01/05/23 neg HR HPV neg, 05/25/17 neg HPV neg History of abnormal Pap or positive HPV:  no Mammogram:   01/27/24 Breast Density Cat B, BIRADS Cat 1 neg  Colonoscopy:  10/27/17 (Care everywhere) - she will schedule Bone Density:  n/a  Result  n/a   Immunization History  Administered Date(s) Administered   Fluzone Influenza virus vaccine,trivalent (IIV3), split virus 07/05/2014, 06/09/2015   Influenza Split 06/21/2011, 06/28/2012, 06/24/2013, 07/05/2013, 07/03/2015   Influenza,inj,Quad PF,6+ Mos 06/28/2014, 05/18/2017, 05/19/2018, 05/29/2019   Influenza,inj,Quad PF,6-35 Mos 06/03/2020   Influenza-Unspecified 05/27/2018   Moderna Sars-Covid-2 Vaccination 11/10/2019, 12/12/2019   Tdap 09/06/2005, 06/24/2006, 09/09/2015      reports that she has never smoked. She has never used smokeless tobacco. She reports that she does not currently use alcohol. She reports that she does not use drugs.  Past Medical History:  Diagnosis Date   Allergy -induced asthma    prn inhaler    Cholecystitis 05/2015   Collar bone fracture    Complication of anesthesia    states unable to breathe when awoke from clavicle fx. surgery - had to be given neb. tx.   Depression    Environmental allergies    Family history of adverse reaction to anesthesia    states mother is hard to wake up post-op   Family history of breast cancer    Family history of colon cancer    Fibroid    Fibromyalgia    GERD (gastroesophageal reflux disease)    History of compression fracture of vertebral column 02/2014   T-12   History of COVID-19 09/26/2020   Migraines    with aura.    Past Surgical History:  Procedure Laterality Date   BREAST BIOPSY Left 2018   benign   BREAST SURGERY  2018   left breast bx - benign fibroadenoma   CHOLECYSTECTOMY N/A 05/21/2015   Procedure: LAPAROSCOPIC CHOLECYSTECTOMY WITH INTRAOPERATIVE CHOLANGIOGRAM;  Surgeon: Donnice Lima, MD;  Location: [facility];  Service: General;  Laterality: N/A;   LAPAROSCOPIC APPENDECTOMY  06/18/2012   Procedure: APPENDECTOMY LAPAROSCOPIC;  Surgeon: Krystal JINNY Russell, MD;  Location: [facility];  Service: General;  Laterality: N/A;   ORIF CLAVICLE FRACTURE Left 2000   PERINEAL LACERATION REPAIR  1980    Current Outpatient Medications  Medication Sig Dispense Refill   ADVAIR  DISKUS 100-50 MCG/DOSE AEPB USE  1 PUFF EVERY 12 HOURS (Patient taking differently: Inhale 1 puff into the lungs in the morning and at bedtime.) 60 each 1   albuterol  (PROVENTIL ) (2.5 MG/3ML) 0.083% nebulizer solution Take 2.5 mg by nebulization every 6 (six) hours as needed.     azelastine  (ASTELIN ) 0.1 % nasal spray USE 1 TO 2 PUFFS IN EACH NOSTRIL ONCE TO TWICE DAILY AS NEEDED 30 mL 2   buPROPion  (WELLBUTRIN  XL) 300 MG 24 hr tablet Take 300 mg by mouth daily.     cetirizine (ZYRTEC) 10 MG tablet Take 10 mg by mouth every other day.     dicyclomine (BENTYL) 20 MG tablet Take 20 mg by mouth 3 (three) times daily as needed for spasms.   0   EPINEPHrine   0.3 mg/0.3 mL IJ SOAJ injection Inject 0.3 mLs (0.3 mg total) into the muscle once. 1 Device 11   fexofenadine  (ALLEGRA ) 180 MG tablet Take 180 mg by mouth every other day.     fluticasone  (FLONASE ) 50 MCG/ACT nasal spray USE 1 TO 2 SPRAYS IN EACH NOSTRIL ONCE DAILY AT BEDTIME (Patient taking differently: Place 1-2 sprays into both nostrils at bedtime.) 16 g 1   Lansoprazole (PREVACID PO) Take by mouth.     levalbuterol (XOPENEX HFA) 45 MCG/ACT inhaler Inhale 2 puffs into the lungs as needed for wheezing.     levocetirizine (XYZAL) 5 MG tablet levocetirizine 5 mg tablet     montelukast (SINGULAIR) 10 MG tablet Take 10 mg by mouth daily.     Multiple Vitamins-Minerals (ONE-A-DAY WOMENS PO) Take 1 tablet by mouth daily.     norethindrone  (MICRONOR ) 0.35 MG tablet TAKE 1 TABLET BY MOUTH EVERY DAY 84 tablet 1   ondansetron  (ZOFRAN ) 4 MG tablet Take 4 mg by mouth every 8 (eight) hours as needed for nausea or vomiting.     ondansetron  (ZOFRAN ) 4 MG tablet Take by mouth.     QUEtiapine (SEROQUEL) 25 MG tablet Take 1 tablet by mouth at bedtime as needed (sleep).   0   saccharomyces boulardii (FLORASTOR) 250 MG capsule See admin instructions.     topiramate (TOPAMAX) 100 MG tablet Take 1 tablet by mouth daily.     diphenhydrAMINE  (SOMINEX) 25 MG tablet Take 25 mg by mouth at bedtime as needed for allergies. (Patient not taking: Reported on 06/20/2024)     No current facility-administered medications for this visit.    ALLERGIES: Strawberry extract, Erythromycin, Sulfonamide derivatives, Tetracyclines & related, Albuterol , Doxycycline hyclate, Monistat [miconazole], Other, Iodine, Latex, and Tape  Family History  Problem Relation Age of Onset   Gallbladder disease Father    Mental illness Mother    Hyperlipidemia Mother    Breast cancer Mother 31   Anesthesia problems Mother        hard to wake up post-op   Colon cancer Maternal Grandfather        d. 30   Leukemia Paternal Grandfather     Alzheimer's disease Paternal Grandfather    Brain cancer Paternal Uncle        benign brain tumor   Congestive Heart Failure Maternal Grandmother     Review of Systems  All other systems reviewed and are negative.   PHYSICAL EXAM:  BP 116/74 (BP Location: Left Arm, Patient Position: Sitting)   Pulse 81   Ht 5' 4.5 (1.638 m)   Wt 160 lb (72.6 kg)   LMP 06/06/2024 (Approximate)   SpO2 99%   Breastfeeding No   BMI 27.04 kg/m  General appearance: alert, cooperative and appears stated age Head: normocephalic, without obvious abnormality, atraumatic Neck: no adenopathy, supple, symmetrical, trachea midline and thyroid  normal to inspection and palpation Lungs: clear to auscultation bilaterally Breasts: normal appearance, no masses or tenderness, No nipple retraction or dimpling, No nipple discharge or bleeding, No axillary adenopathy Heart: regular rate and rhythm Abdomen: soft, non-tender; no masses, no organomegaly Extremities: extremities normal, atraumatic, no cyanosis or edema Skin: skin color, texture, turgor normal. No rashes or lesions Lymph nodes: cervical, supraclavicular, and axillary nodes normal. Neurologic: grossly normal  Pelvic: External genitalia:  no lesions              No abnormal inguinal nodes palpated.              Urethra:  normal appearing urethra with no masses, tenderness or lesions              Bartholins and Skenes: normal                 Vagina: normal appearing vagina with normal color and discharge, no lesions              Cervix: no lesions              Pap taken: {yes no:314532} Bimanual Exam:  Uterus:  normal size, contour, position, consistency, mobility, non-tender              Adnexa: no mass, fullness, tenderness              Rectal exam: {yes no:314532}.  Confirms.              Anus:  normal sphincter tone, no lesions  Chaperone was present for exam:  {BSCHAPERONE:31226::Emily F, CMA}  ASSESSMENT: Well woman visit with gynecologic  exam. Migraine with aura. On Topamax 100 mg.  Mother with breast cancer age 67.   Uncertain family history.   Negative genetic testing.  Status post left breast biopsy - fibroadenoma.  Hx fibroid.  10 mm fundal.  PHQ-2-9: 0 GSI.  kegls ***  PLAN: Mammogram screening discussed. Self breast awareness reviewed. Pap and HRV collected:  {yes no:314532} Guidelines for Calcium, Vitamin D, regular exercise program including cardiovascular and weight bearing exercise. Medication refills:  *** {LABS (Optional):23779} Follow up:  ***    Additional counseling given.  {yes [id number]. ***  total time was spent for this patient encounter, including preparation, face-to-face counseling with the patient, coordination of care, and documentation of the encounter in addition to doing the well woman visit with gynecologic exam.

## 2024-06-20 NOTE — Patient Instructions (Signed)
 Kegel Exercises There are many reasons your provider may suggest Kegel exercises. This video will teach you how to do Kegel exercises. To view the content, go to this web address: [email or web address]  This video will expire on: 08/17/2025. If you need access to this video following this date, please reach out to the healthcare provider who assigned it to you. This information is not intended to replace advice given to you by your health care provider. Make sure you discuss any questions you have with your health care provider. Elsevier Patient Education  2024 Elsevier Inc.  EXERCISE AND DIET:  We recommended that you start or continue a regular exercise program for good health. Regular exercise means any activity that makes your heart beat faster and makes you sweat.  We recommend exercising at least 30 minutes per day at least 3 days a week, preferably 4 or 5.  We also recommend a diet low in fat and sugar.  Inactivity, poor dietary choices and obesity can cause diabetes, heart attack, stroke, and kidney damage, among others.    ALCOHOL AND SMOKING:  Women should limit their alcohol intake to no more than 7 drinks/beers/glasses of wine (combined, not each!) per week. Moderation of alcohol intake to this level decreases your risk of breast cancer and liver damage. And of course, no recreational drugs are part of a healthy lifestyle.  And absolutely no smoking or even second hand smoke. Most people know smoking can cause heart and lung diseases, but did you know it also contributes to weakening of your bones? Aging of your skin?  Yellowing of your teeth and nails?  CALCIUM AND VITAMIN D:  Adequate intake of calcium and Vitamin D are recommended.  The recommendations for exact amounts of these supplements seem to change often, but generally speaking 600 mg of calcium (either carbonate or citrate) and 800 units of Vitamin D per day seems prudent. Certain women may benefit from higher intake  of Vitamin D.  If you are among these women, your doctor will have told you during your visit.    PAP SMEARS:  Pap smears, to check for cervical cancer or precancers,  have traditionally been done yearly, although recent scientific advances have shown that most women can have pap smears less often.  However, every woman still should have a physical exam from her gynecologist every year. It will include a breast check, inspection of the vulva and vagina to check for abnormal growths or skin changes, a visual exam of the cervix, and then an exam to evaluate the size and shape of the uterus and ovaries.  And after 47 years of age, a rectal exam is indicated to check for rectal cancers. We will also provide age appropriate advice regarding health maintenance, like when you should have certain vaccines, screening for sexually transmitted diseases, bone density testing, colonoscopy, mammograms, etc.   MAMMOGRAMS:  All women over 61 years old should have a yearly mammogram. Many facilities now offer a "3D" mammogram, which may cost around $50 extra out of pocket. If possible,  we recommend you accept the option to have the 3D mammogram performed.  It both reduces the number of women who will be called back for extra views which then turn out to be normal, and it is better than the routine mammogram at detecting truly abnormal areas.    COLONOSCOPY:  Colonoscopy to screen for colon cancer is recommended for all women at age 57.  We know, you hate the idea of the  prep.  We agree, BUT, having colon cancer and not knowing it is worse!!  Colon cancer so often starts as a polyp that can be seen and removed at colonscopy, which can quite literally save your life!  And if your first colonoscopy is normal and you have no family history of colon cancer, most women don't have to have it again for 10 years.  Once every ten years, you can do something that may end up saving your life, right?  We will be happy to help you get it  scheduled when you are ready.  Be sure to check your insurance coverage so you understand how much it will cost.  It may be covered as a preventative service at no cost, but you should check your particular policy.

## 2024-06-21 ENCOUNTER — Ambulatory Visit: Payer: Self-pay | Admitting: Obstetrics and Gynecology

## 2024-06-21 DIAGNOSIS — R790 Abnormal level of blood mineral: Secondary | ICD-10-CM

## 2024-06-21 LAB — CBC
HCT: 43.9 % (ref 35.0–45.0)
Hemoglobin: 14.3 g/dL (ref 11.7–15.5)
MCH: 29.5 pg (ref 27.0–33.0)
MCHC: 32.6 g/dL (ref 32.0–36.0)
MCV: 90.7 fL (ref 80.0–100.0)
MPV: 9.7 fL (ref 7.5–12.5)
Platelets: 299 Thousand/uL (ref 140–400)
RBC: 4.84 Million/uL (ref 3.80–5.10)
RDW: 12.1 % (ref 11.0–15.0)
WBC: 7.6 Thousand/uL (ref 3.8–10.8)

## 2024-06-21 LAB — IRON: Iron: 43 ug/dL (ref 40–190)

## 2024-06-21 LAB — T4, FREE: Free T4: 1.2 ng/dL (ref 0.8–1.8)

## 2024-06-21 LAB — FERRITIN: Ferritin: 14 ng/mL — ABNORMAL LOW (ref 16–232)

## 2024-06-21 LAB — TSH: TSH: 0.79 m[IU]/L

## 2024-06-22 ENCOUNTER — Other Ambulatory Visit: Payer: Self-pay | Admitting: Obstetrics and Gynecology

## 2024-06-22 MED ORDER — FERROUS BISGLYCINATE CHELATE 28 MG PO CAPS: 1.0000 | ORAL_CAPSULE | Freq: Every day | ORAL | 3 refills | Status: AC

## 2024-06-22 NOTE — Telephone Encounter (Signed)
 Dr. Glennon -only options are ferrous bisglycinate 28 mg CAPS or powder  Would you like to update Rx recommendations?

## 2024-06-22 NOTE — Telephone Encounter (Signed)
Routing to Dr. Karma Greaser.

## 2024-07-11 ENCOUNTER — Other Ambulatory Visit: Payer: Self-pay | Admitting: Medical Genetics

## 2024-07-11 DIAGNOSIS — Z006 Encounter for examination for normal comparison and control in clinical research program: Secondary | ICD-10-CM

## 2024-08-16 ENCOUNTER — Other Ambulatory Visit

## 2024-08-16 DIAGNOSIS — R790 Abnormal level of blood mineral: Secondary | ICD-10-CM

## 2024-08-17 LAB — CBC
HCT: 43.2 % (ref 35.9–46.0)
Hemoglobin: 14.4 g/dL (ref 11.7–15.5)
MCH: 29.9 pg (ref 27.0–33.0)
MCHC: 33.3 g/dL (ref 31.6–35.4)
MCV: 89.8 fL (ref 81.4–101.7)
MPV: 9.9 fL (ref 7.5–12.5)
Platelets: 288 Thousand/uL (ref 140–400)
RBC: 4.81 Million/uL (ref 3.80–5.10)
RDW: 12.6 % (ref 11.0–15.0)
WBC: 9.5 Thousand/uL (ref 3.8–10.8)

## 2024-08-17 LAB — FERRITIN: Ferritin: 37 ng/mL (ref 16–232)

## 2024-08-17 LAB — IRON: Iron: 101 ug/dL (ref 40–190)

## 2024-08-19 ENCOUNTER — Ambulatory Visit: Payer: Self-pay | Admitting: Obstetrics and Gynecology

## 2024-08-19 DIAGNOSIS — R790 Abnormal level of blood mineral: Secondary | ICD-10-CM

## 2025-06-26 ENCOUNTER — Ambulatory Visit: Admitting: Obstetrics and Gynecology

## 2025-07-03 ENCOUNTER — Ambulatory Visit: Admitting: Obstetrics and Gynecology
